# Patient Record
Sex: Female | Born: 1940 | Race: White | Hispanic: No | Smoking: Former smoker
Health system: Southern US, Community
[De-identification: ages and names within clinical notes are randomized; demographics above are authoritative.]

## PROBLEM LIST (undated history)

## (undated) DIAGNOSIS — T8859XA Other complications of anesthesia, initial encounter: Secondary | ICD-10-CM

## (undated) DIAGNOSIS — Z9889 Other specified postprocedural states: Secondary | ICD-10-CM

## (undated) DIAGNOSIS — Z87898 Personal history of other specified conditions: Secondary | ICD-10-CM

## (undated) DIAGNOSIS — T4145XA Adverse effect of unspecified anesthetic, initial encounter: Secondary | ICD-10-CM

## (undated) DIAGNOSIS — Z9289 Personal history of other medical treatment: Secondary | ICD-10-CM

## (undated) DIAGNOSIS — K8050 Calculus of bile duct without cholangitis or cholecystitis without obstruction: Secondary | ICD-10-CM

## (undated) DIAGNOSIS — I10 Essential (primary) hypertension: Secondary | ICD-10-CM

## (undated) DIAGNOSIS — E11621 Type 2 diabetes mellitus with foot ulcer: Secondary | ICD-10-CM

## (undated) DIAGNOSIS — R112 Nausea with vomiting, unspecified: Secondary | ICD-10-CM

## (undated) DIAGNOSIS — E119 Type 2 diabetes mellitus without complications: Secondary | ICD-10-CM

## (undated) DIAGNOSIS — L97529 Non-pressure chronic ulcer of other part of left foot with unspecified severity: Secondary | ICD-10-CM

## (undated) DIAGNOSIS — I739 Peripheral vascular disease, unspecified: Secondary | ICD-10-CM

## (undated) DIAGNOSIS — E785 Hyperlipidemia, unspecified: Secondary | ICD-10-CM

## (undated) HISTORY — DX: Personal history of other medical treatment: Z92.89

## (undated) HISTORY — DX: Essential (primary) hypertension: I10

## (undated) HISTORY — DX: Type 2 diabetes mellitus without complications: E11.9

## (undated) HISTORY — DX: Personal history of other specified conditions: Z87.898

## (undated) HISTORY — DX: Hyperlipidemia, unspecified: E78.5

---

## 1997-06-08 HISTORY — PX: ROTATOR CUFF REPAIR: SHX139

## 1997-07-20 ENCOUNTER — Other Ambulatory Visit: Admission: RE | Admit: 1997-07-20 | Discharge: 1997-07-20 | Payer: Self-pay | Admitting: Internal Medicine

## 1997-10-25 ENCOUNTER — Ambulatory Visit (HOSPITAL_BASED_OUTPATIENT_CLINIC_OR_DEPARTMENT_OTHER): Admission: RE | Admit: 1997-10-25 | Discharge: 1997-10-25 | Payer: Self-pay | Admitting: Orthopedic Surgery

## 1997-10-29 ENCOUNTER — Encounter: Admission: RE | Admit: 1997-10-29 | Discharge: 1998-01-27 | Payer: Self-pay | Admitting: Orthopedic Surgery

## 1997-12-21 ENCOUNTER — Other Ambulatory Visit: Admission: RE | Admit: 1997-12-21 | Discharge: 1997-12-21 | Payer: Self-pay | Admitting: Internal Medicine

## 1998-01-28 ENCOUNTER — Encounter: Admission: RE | Admit: 1998-01-28 | Discharge: 1998-04-28 | Payer: Self-pay | Admitting: Internal Medicine

## 1998-06-13 ENCOUNTER — Other Ambulatory Visit: Admission: RE | Admit: 1998-06-13 | Discharge: 1998-06-13 | Payer: Self-pay | Admitting: Obstetrics and Gynecology

## 1999-06-05 ENCOUNTER — Ambulatory Visit (HOSPITAL_COMMUNITY): Admission: RE | Admit: 1999-06-05 | Discharge: 1999-06-05 | Payer: Self-pay | Admitting: Gastroenterology

## 1999-10-06 ENCOUNTER — Encounter: Admission: RE | Admit: 1999-10-06 | Discharge: 2000-01-04 | Payer: Self-pay | Admitting: Internal Medicine

## 2000-01-19 ENCOUNTER — Encounter: Admission: RE | Admit: 2000-01-19 | Discharge: 2000-04-18 | Payer: Self-pay | Admitting: Internal Medicine

## 2000-03-12 ENCOUNTER — Encounter: Payer: Self-pay | Admitting: Orthopedic Surgery

## 2000-03-12 ENCOUNTER — Encounter: Admission: RE | Admit: 2000-03-12 | Discharge: 2000-03-12 | Payer: Self-pay | Admitting: Orthopedic Surgery

## 2000-03-16 ENCOUNTER — Other Ambulatory Visit: Admission: RE | Admit: 2000-03-16 | Discharge: 2000-03-16 | Payer: Self-pay | Admitting: Orthopaedic Surgery

## 2000-04-27 ENCOUNTER — Other Ambulatory Visit: Admission: RE | Admit: 2000-04-27 | Discharge: 2000-04-27 | Payer: Self-pay | Admitting: Obstetrics and Gynecology

## 2000-05-08 HISTORY — PX: CARDIAC CATHETERIZATION: SHX172

## 2000-06-02 ENCOUNTER — Encounter: Admission: RE | Admit: 2000-06-02 | Discharge: 2000-06-02 | Payer: Self-pay | Admitting: Cardiology

## 2000-06-02 ENCOUNTER — Encounter: Payer: Self-pay | Admitting: Cardiology

## 2000-06-04 ENCOUNTER — Ambulatory Visit (HOSPITAL_COMMUNITY): Admission: RE | Admit: 2000-06-04 | Discharge: 2000-06-04 | Payer: Self-pay | Admitting: Cardiology

## 2001-01-18 ENCOUNTER — Ambulatory Visit (HOSPITAL_COMMUNITY): Admission: RE | Admit: 2001-01-18 | Discharge: 2001-01-18 | Payer: Self-pay | Admitting: Family Medicine

## 2001-02-18 ENCOUNTER — Encounter: Payer: Self-pay | Admitting: Family Medicine

## 2001-02-18 ENCOUNTER — Encounter: Admission: RE | Admit: 2001-02-18 | Discharge: 2001-02-18 | Payer: Self-pay | Admitting: Family Medicine

## 2001-05-24 ENCOUNTER — Other Ambulatory Visit: Admission: RE | Admit: 2001-05-24 | Discharge: 2001-05-24 | Payer: Self-pay | Admitting: Obstetrics and Gynecology

## 2001-10-06 HISTORY — PX: KNEE ARTHROSCOPY: SUR90

## 2001-11-08 ENCOUNTER — Ambulatory Visit: Admission: RE | Admit: 2001-11-08 | Discharge: 2001-11-08 | Payer: Self-pay | Admitting: Orthopedic Surgery

## 2001-12-07 ENCOUNTER — Encounter (HOSPITAL_BASED_OUTPATIENT_CLINIC_OR_DEPARTMENT_OTHER): Admission: RE | Admit: 2001-12-07 | Discharge: 2002-02-09 | Payer: Self-pay | Admitting: Internal Medicine

## 2002-02-15 ENCOUNTER — Encounter: Admission: RE | Admit: 2002-02-15 | Discharge: 2002-03-07 | Payer: Self-pay | Admitting: Internal Medicine

## 2002-03-08 ENCOUNTER — Encounter (HOSPITAL_BASED_OUTPATIENT_CLINIC_OR_DEPARTMENT_OTHER): Admission: RE | Admit: 2002-03-08 | Discharge: 2002-06-06 | Payer: Self-pay | Admitting: Internal Medicine

## 2002-04-21 ENCOUNTER — Encounter (HOSPITAL_BASED_OUTPATIENT_CLINIC_OR_DEPARTMENT_OTHER): Payer: Self-pay | Admitting: Internal Medicine

## 2002-04-21 ENCOUNTER — Encounter: Admission: RE | Admit: 2002-04-21 | Discharge: 2002-04-21 | Payer: Self-pay | Admitting: Internal Medicine

## 2002-05-26 ENCOUNTER — Other Ambulatory Visit: Admission: RE | Admit: 2002-05-26 | Discharge: 2002-05-26 | Payer: Self-pay | Admitting: Obstetrics and Gynecology

## 2002-06-27 ENCOUNTER — Encounter (HOSPITAL_BASED_OUTPATIENT_CLINIC_OR_DEPARTMENT_OTHER): Admission: RE | Admit: 2002-06-27 | Discharge: 2002-09-25 | Payer: Self-pay | Admitting: Internal Medicine

## 2002-09-25 ENCOUNTER — Encounter (HOSPITAL_BASED_OUTPATIENT_CLINIC_OR_DEPARTMENT_OTHER): Admission: RE | Admit: 2002-09-25 | Discharge: 2002-12-24 | Payer: Self-pay | Admitting: Internal Medicine

## 2002-12-29 ENCOUNTER — Encounter (HOSPITAL_BASED_OUTPATIENT_CLINIC_OR_DEPARTMENT_OTHER): Admission: RE | Admit: 2002-12-29 | Discharge: 2003-03-29 | Payer: Self-pay | Admitting: Internal Medicine

## 2003-04-09 HISTORY — PX: TOE SURGERY: SHX1073

## 2003-10-02 ENCOUNTER — Other Ambulatory Visit: Admission: RE | Admit: 2003-10-02 | Discharge: 2003-10-02 | Payer: Self-pay | Admitting: Obstetrics and Gynecology

## 2004-06-08 DIAGNOSIS — Z9289 Personal history of other medical treatment: Secondary | ICD-10-CM

## 2004-06-08 HISTORY — DX: Personal history of other medical treatment: Z92.89

## 2004-07-09 ENCOUNTER — Ambulatory Visit: Admission: RE | Admit: 2004-07-09 | Discharge: 2004-07-09 | Payer: Self-pay | Admitting: Orthopedic Surgery

## 2004-08-06 ENCOUNTER — Ambulatory Visit: Payer: Self-pay | Admitting: Physical Medicine & Rehabilitation

## 2004-08-06 ENCOUNTER — Inpatient Hospital Stay (HOSPITAL_COMMUNITY): Admission: RE | Admit: 2004-08-06 | Discharge: 2004-08-11 | Payer: Self-pay | Admitting: Orthopedic Surgery

## 2004-10-21 ENCOUNTER — Ambulatory Visit (HOSPITAL_COMMUNITY): Admission: RE | Admit: 2004-10-21 | Discharge: 2004-10-21 | Payer: Self-pay | Admitting: Gastroenterology

## 2006-04-26 ENCOUNTER — Encounter: Admission: RE | Admit: 2006-04-26 | Discharge: 2006-04-26 | Payer: Self-pay | Admitting: Orthopedic Surgery

## 2006-05-12 ENCOUNTER — Ambulatory Visit (HOSPITAL_BASED_OUTPATIENT_CLINIC_OR_DEPARTMENT_OTHER): Admission: RE | Admit: 2006-05-12 | Discharge: 2006-05-12 | Payer: Self-pay | Admitting: Orthopedic Surgery

## 2010-05-08 HISTORY — PX: TRANSTHORACIC ECHOCARDIOGRAM: SHX275

## 2013-03-16 ENCOUNTER — Encounter: Payer: Self-pay | Admitting: *Deleted

## 2013-03-16 ENCOUNTER — Ambulatory Visit (INDEPENDENT_AMBULATORY_CARE_PROVIDER_SITE_OTHER): Payer: BC Managed Care – PPO | Admitting: Internal Medicine

## 2013-03-16 ENCOUNTER — Encounter: Payer: Self-pay | Admitting: Internal Medicine

## 2013-03-16 VITALS — BP 124/58 | HR 82 | Ht 67.0 in | Wt 192.9 lb

## 2013-03-16 DIAGNOSIS — R002 Palpitations: Secondary | ICD-10-CM

## 2013-03-16 DIAGNOSIS — E785 Hyperlipidemia, unspecified: Secondary | ICD-10-CM

## 2013-03-16 DIAGNOSIS — I10 Essential (primary) hypertension: Secondary | ICD-10-CM | POA: Insufficient documentation

## 2013-03-16 DIAGNOSIS — E119 Type 2 diabetes mellitus without complications: Secondary | ICD-10-CM | POA: Insufficient documentation

## 2013-03-16 NOTE — Progress Notes (Signed)
OFFICE NOTE  Chief Complaint:  Routine follow-up  Primary Care Physician: Raynelle Jan., MD  HPI:  Rachel Wong  is a 72 year old female previously followed by Dr. Clarene Duke with history of palpitations improved with electrolyte replacement and adjustments in her beta blocker. She also has hypertension which has been well controlled, hyperlipidemia and diabetes. She reports being fairly stable. I reviewed results from her primary care provider's office. She recently had a cholesterol check which showed total cholesterol to be 158. Her triglycerides were slightly elevated and she is continuing to work on her diet. She has lost several pounds and I think this will be helpful for her. With her and her blood glucose control, her hemoglobin A1c is 5.8 and has been stable at that level. She denies any further palpitations. She occasionally gets cramps in her left leg and does have varicose veins. There is a slight amount of swelling and she previously had left knee surgery. Other than those few episodes, she is really asymptomatic.  PMHx:  Past Medical History  Diagnosis Date  . History of palpitations   . Hypertension   . Hyperlipidemia   . Diabetes   . History of nuclear stress test 2006    adenosine; normal perfusion, no fixed or reversible defects    Past Surgical History  Procedure Laterality Date  . Cardiac catheterization  05/2000    r/t false-positive stress test; normal coronaries - Dr. Mervyn Skeeters. [facility]   . Transthoracic echocardiogram  05/2010    EF=>55%, stage 1 diastolic dysfunction; mild MR; mild AVR & AV mildly sclerotic  . Rotator cuff repair  1999  . Knee arthroscopy Right 10/2001  . Toe surgery Right 04/2003    FAMHx:  Family History  Problem Relation Age of Onset  . Cancer Mother   . Alzheimer's disease Mother   . Lung cancer Father   . Cancer Maternal Grandmother   . Heart attack Maternal Grandfather   . Liver disease Brother     transplant  . Thyroid disease  Child     thyroidectomy    SOCHx:   reports that she quit smoking about 46 years ago. Her smoking use included Cigarettes. She has a 20 pack-year smoking history. She has never used smokeless tobacco. Her alcohol and drug histories are not on file.  ALLERGIES:  No Known Allergies  ROS: A comprehensive review of systems was negative except for: Cardiovascular: positive for lower extremity edema and palpitations  HOME MEDS: Current Outpatient Prescriptions  Medication Sig Dispense Refill  . calcium citrate (CALCITRATE - DOSED IN MG ELEMENTAL CALCIUM) 950 MG tablet Take 1 tablet by mouth daily.      . enalapril (VASOTEC) 20 MG tablet Take 20 mg by mouth daily.      . magnesium oxide (MAG-OX) 400 MG tablet Take 400 mg by mouth daily.      . metFORMIN (GLUCOPHAGE-XR) 500 MG 24 hr tablet Take 2 tablets by mouth 2 (two) times daily.      . metoprolol succinate (TOPROL-XL) 100 MG 24 hr tablet Take 50 mg by mouth daily.       . Multiple Vitamin (MULTIVITAMIN) capsule Take 1 capsule by mouth daily.      . Omega-3 Fatty Acids (FISH OIL) 1000 MG CAPS Take 1 capsule by mouth daily.       . pravastatin (PRAVACHOL) 40 MG tablet Take 1 tablet by mouth daily.      Marland Kitchen VICTOZA 18 MG/3ML SOPN Inject 1.8 mg into the skin  every morning.        No current facility-administered medications for this visit.    LABS/IMAGING: No results found for this or any previous visit (from the past 48 hour(s)). No results found.  VITALS: BP 124/58  Pulse 82  Ht 5\' 7"  (1.702 m)  Wt 192 lb 14.4 oz (87.499 kg)  BMI 30.21 kg/m2  EXAM: General appearance: alert and no distress Neck: no adenopathy, no carotid bruit, no JVD, supple, symmetrical, trachea midline and thyroid not enlarged, symmetric, no tenderness/mass/nodules Lungs: clear to auscultation bilaterally Heart: regular rate and rhythm, S1, S2 normal, no murmur, click, rub or gallop Abdomen: soft, non-tender; bowel sounds normal; no masses,  no  organomegaly Extremities: extremities normal, atraumatic, no cyanosis or edema Pulses: 2+ and symmetric A few varicose veins in the left leg, mild hemosiderin deposition Skin: Skin color, texture, turgor normal. No rashes or lesions Neurologic: Grossly normal Psych: Mood, affect pleasant, normal  EKG: Normal sinus rhythm at 82  ASSESSMENT: 1. Palpitations-controlled 2. Hypertension-at goal on enalapril and metoprolol 3. Dyslipidemia-total cholesterol 158 on pravastatin and fish oil 4. Diabetes type 2-well controlled, A1c 5.8  PLAN: 1.   [name] is doing well without any complaints. Her diabetes, hypertension and dyslipidemia are all well controlled. She is managing to lose some weight and she's had no further palpitations. I would recommend continuing her current medications and we will see her back in one year.  Chrystie Nose, MD, [facility] Attending Cardiologist [facility]  HILTY,Kenneth C 03/16/2013, 5:46 PM

## 2013-03-16 NOTE — Patient Instructions (Signed)
Your physician wants you to follow-up in: 1 year. You will receive a reminder letter in the mail two months in advance. If you don't receive a letter, please call our office to schedule the follow-up appointment.  

## 2013-03-17 ENCOUNTER — Encounter: Payer: Self-pay | Admitting: Internal Medicine

## 2014-04-06 ENCOUNTER — Ambulatory Visit (INDEPENDENT_AMBULATORY_CARE_PROVIDER_SITE_OTHER): Payer: BC Managed Care – PPO | Admitting: Internal Medicine

## 2014-04-06 ENCOUNTER — Encounter: Payer: Self-pay | Admitting: Internal Medicine

## 2014-04-06 VITALS — BP 144/69 | HR 87 | Ht 65.5 in | Wt 183.8 lb

## 2014-04-06 DIAGNOSIS — E785 Hyperlipidemia, unspecified: Secondary | ICD-10-CM

## 2014-04-06 DIAGNOSIS — I10 Essential (primary) hypertension: Secondary | ICD-10-CM

## 2014-04-06 DIAGNOSIS — E119 Type 2 diabetes mellitus without complications: Secondary | ICD-10-CM

## 2014-04-06 DIAGNOSIS — R002 Palpitations: Secondary | ICD-10-CM

## 2014-04-06 NOTE — Progress Notes (Signed)
OFFICE NOTE  Chief Complaint:  Routine follow-up  Primary Care Physician: Raynelle JanSPRY,HEATHER M., MD  HPI:  Rachel Wong  is a 73 year old female previously followed by Dr. Clarene DukeLittle with history of palpitations improved with electrolyte replacement and adjustments in her beta blocker. She also has hypertension which has been well controlled, hyperlipidemia and diabetes. She reports being fairly stable. I reviewed results from her primary care provider's office. She recently had a cholesterol check which showed total cholesterol to be 158. Her triglycerides were slightly elevated and she is continuing to work on her diet. She has lost several pounds and I think this will be helpful for her. With her and her blood glucose control, her hemoglobin A1c is 5.8 and has been stable at that level. She denies any further palpitations. She occasionally gets cramps in her left leg and does have varicose veins. There is a slight amount of swelling and she previously had left knee surgery. Other than those few episodes, she is really asymptomatic.  Rachel Wong returns today for follow-up. She denies any palpitations. She reports that as long she takes her magnesium she seems to be doing fairly well. She is talking about possibly retiring in December. 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 Her blood pressure is well controlled. She reports her A1c is at 6 which represents good control.  PMHx:  Past Medical History  Diagnosis Date  . History of palpitations   . Hypertension   . Hyperlipidemia   . Diabetes   . History of nuclear stress test 2006    adenosine; normal perfusion, no fixed or reversible defects    Past Surgical History  Procedure Laterality Date  . Cardiac catheterization  05/2000    r/t false-positive stress test; normal coronaries - Dr. Mervyn SkeetersA. [facility]   . Transthoracic echocardiogram  05/2010    EF=>55%, stage 1 diastolic dysfunction; mild MR; mild AVR & AV mildly sclerotic    . Rotator cuff repair  1999  . Knee arthroscopy Right 10/2001  . Toe surgery Right 04/2003    FAMHx:  Family History  Problem Relation Age of Onset  . Cancer Mother   . Alzheimer's disease Mother   . Lung cancer Father   . Cancer Maternal Grandmother   . Heart attack Maternal Grandfather   . Liver disease Brother     transplant  . Thyroid disease Child     thyroidectomy    SOCHx:   reports that she quit smoking about 47 years ago. Her smoking use included Cigarettes. She has a 20 pack-year smoking history. She has never used smokeless tobacco. Her alcohol and drug histories are not on file.  ALLERGIES:  No Known Allergies  ROS: A comprehensive review of systems was negative.  HOME MEDS: Current Outpatient Prescriptions  Medication Sig Dispense Refill  . calcium citrate (CALCITRATE - DOSED IN MG ELEMENTAL CALCIUM) 950 MG tablet Take 1 tablet by mouth daily.      . enalapril (VASOTEC) 20 MG tablet Take 20 mg by mouth daily.      . magnesium oxide (MAG-OX) 400 MG tablet Take 400 mg by mouth daily.      . metFORMIN (GLUCOPHAGE-XR) 500 MG 24 hr tablet Take 2 tablets by mouth 2 (two) times daily.      . metoprolol succinate (TOPROL-XL) 100 MG 24 hr tablet Take 50 mg by mouth daily.       . Multiple Vitamin (MULTIVITAMIN) capsule Take 1 capsule by mouth daily.      .Marland Kitchen  Omega-3 Fatty Acids (FISH OIL) 1000 MG CAPS Take 1 capsule by mouth daily.       . pravastatin (PRAVACHOL) 40 MG tablet Take 1 tablet by mouth daily.      Marland Kitchen. VICTOZA 18 MG/3ML SOPN Inject 1.8 mg into the skin every morning.        No current facility-administered medications for this visit.    LABS/IMAGING: No results found for this or any previous visit (from the past 48 hour(s)). No results found.  VITALS: BP 144/69  Pulse 87  Ht 5' 5.5" (1.664 m)  Wt 183 lb 12.8 oz (83.371 kg)  BMI 30.11 kg/m2  EXAM: General appearance: alert and no distress Neck: no adenopathy, no carotid bruit, no JVD, supple,  symmetrical, trachea midline and thyroid not enlarged, symmetric, no tenderness/mass/nodules Lungs: clear to auscultation bilaterally Heart: regular rate and rhythm, S1, S2 normal, no murmur, click, rub or gallop Abdomen: soft, non-tender; bowel sounds normal; no masses,  no organomegaly Extremities: extremities normal, atraumatic, no cyanosis or edema Pulses: 2+ and symmetric A few varicose veins in the left leg, mild hemosiderin deposition Skin: Skin color, texture, turgor normal. No rashes or lesions Neurologic: Grossly normal Psych: Mood, affect pleasant, normal  EKG: Normal sinus rhythm at 87  ASSESSMENT: 1. Palpitations-controlled 2. Hypertension-at goal on enalapril and metoprolol 3. Dyslipidemia-total cholesterol 158 on pravastatin and fish oil 4. Diabetes type 2-well controlled, A1c 6  PLAN: 1.   Rachel Wong is doing well without any complaints. Her diabetes, hypertension and dyslipidemia are all well controlled. She is managing to lose some weight and she's had no further palpitations. I would recommend continuing her current medications and we will see her back in one year.  Chrystie NoseKenneth C. Tayte Mcwherter, MD, [facility] Attending Cardiologist [facility]  Analis Distler C 04/06/2014, 5:44 PM

## 2014-04-06 NOTE — Patient Instructions (Signed)
Your physician wants you to follow-up in: 1 year with Dr. Hilty. You will receive a reminder letter in the mail two months in advance. If you don't receive a letter, please call our office to schedule the follow-up appointment.  

## 2015-05-08 ENCOUNTER — Ambulatory Visit (INDEPENDENT_AMBULATORY_CARE_PROVIDER_SITE_OTHER): Payer: BC Managed Care – PPO | Admitting: Internal Medicine

## 2015-05-08 ENCOUNTER — Encounter: Payer: Self-pay | Admitting: Internal Medicine

## 2015-05-08 VITALS — BP 100/60 | HR 83 | Ht 65.0 in | Wt 176.9 lb

## 2015-05-08 DIAGNOSIS — E785 Hyperlipidemia, unspecified: Secondary | ICD-10-CM

## 2015-05-08 DIAGNOSIS — I10 Essential (primary) hypertension: Secondary | ICD-10-CM | POA: Diagnosis not present

## 2015-05-08 DIAGNOSIS — R002 Palpitations: Secondary | ICD-10-CM | POA: Diagnosis not present

## 2015-05-08 NOTE — Progress Notes (Signed)
OFFICE NOTE  Chief Complaint:  Routine follow-up  Primary Care Physician: Raynelle JanSPRY,HEATHER M., MD  HPI:  Rachel Wong  is a 74 year old female previously followed by Dr. Clarene DukeLittle with history of palpitations improved with electrolyte replacement and adjustments in her beta blocker. She also has hypertension which has been well controlled, hyperlipidemia and diabetes. She reports being fairly stable. I reviewed results from her primary care provider's office. She recently had a cholesterol check which showed total cholesterol to be 158. Her triglycerides were slightly elevated and she is continuing to work on her diet. She has lost several pounds and I think this will be helpful for her. With her and her blood glucose control, her hemoglobin A1c is 5.8 and has been stable at that level. She denies any further palpitations. She occasionally gets cramps in her left leg and does have varicose veins. There is a slight amount of swelling and she previously had left knee surgery. Other than those few episodes, she is really asymptomatic.  Rachel Wong returns today for follow-up. She denies any palpitations. She reports that as long she takes her magnesium she seems to be doing fairly well. She is talking about possibly retiring in December. 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 Her blood pressure is well controlled. She reports her A1c is at 6 which represents good control.  Rachel Wong returns today for follow-up. She is reportedly asymptomatic, denying any palpitations, chest pain or shortness of breath. She has mildly low blood pressure today although reports a generally is between 120 and 140 systolic. Her EKG shows normal sinus rhythm without any ischemic changes. She occasionally gets some cramping in her legs and her hands. She is takes magnesium oxide but this is not remedy the problem. After talking to her daughter she is convinced this is related to pravastatin. She's been on  this for some time with good cholesterol control.  PMHx:  Past Medical History  Diagnosis Date  . History of palpitations   . Hypertension   . Hyperlipidemia   . Diabetes (HCC)   . History of nuclear stress test 2006    adenosine; normal perfusion, no fixed or reversible defects    Past Surgical History  Procedure Laterality Date  . Cardiac catheterization  05/2000    r/t false-positive stress test; normal coronaries - Dr. Mervyn SkeetersA. [facility]   . Transthoracic echocardiogram  05/2010    EF=>55%, stage 1 diastolic dysfunction; mild MR; mild AVR & AV mildly sclerotic  . Rotator cuff repair  1999  . Knee arthroscopy Right 10/2001  . Toe surgery Right 04/2003    FAMHx:  Family History  Problem Relation Age of Onset  . Cancer Mother   . Alzheimer's disease Mother   . Lung cancer Father   . Cancer Maternal Grandmother   . Heart attack Maternal Grandfather   . Liver disease Brother     transplant  . Thyroid disease Child     thyroidectomy    SOCHx:   reports that she quit smoking about 48 years ago. Her smoking use included Cigarettes. She has a 20 pack-year smoking history. She has never used smokeless tobacco. Her alcohol and drug histories are not on file.  ALLERGIES:  No Known Allergies  ROS: A comprehensive review of systems was negative except for: Musculoskeletal: positive for myalgias and Hand and feet cramping  HOME MEDS: Current Outpatient Prescriptions  Medication Sig Dispense Refill  . alendronate (FOSAMAX) 70 MG tablet Take 70 mg  by mouth once a week.    . calcium citrate (CALCITRATE - DOSED IN MG ELEMENTAL CALCIUM) 950 MG tablet Take 1 tablet by mouth daily.    . enalapril (VASOTEC) 20 MG tablet Take 20 mg by mouth daily.    . magnesium oxide (MAG-OX) 400 MG tablet Take 400 mg by mouth daily.    . metFORMIN (GLUCOPHAGE-XR) 500 MG 24 hr tablet Take 2 tablets by mouth 2 (two) times daily.    . metoprolol succinate (TOPROL-XL) 100 MG 24 hr tablet Take 50 mg by mouth  daily.     . Multiple Vitamin (MULTIVITAMIN) capsule Take 1 capsule by mouth daily.    . Omega-3 Fatty Acids (FISH OIL) 1000 MG CAPS Take 1 capsule by mouth daily.     . pravastatin (PRAVACHOL) 40 MG tablet Take 1 tablet by mouth daily.    Marland Kitchen VICTOZA 18 MG/3ML SOPN Inject 1.8 mg into the skin every morning.      No current facility-administered medications for this visit.    LABS/IMAGING: No results found for this or any previous visit (from the past 48 hour(s)). No results found.  VITALS: BP 100/60 mmHg  Pulse 83  Ht  (1.651 m)  Wt 176 lb 14.4 oz (80.241 kg)  BMI 29.44 kg/m2  EXAM: General appearance: alert and no distress Neck: no adenopathy, no carotid bruit, no JVD, supple, symmetrical, trachea midline and thyroid not enlarged, symmetric, no tenderness/mass/nodules Lungs: clear to auscultation bilaterally Heart: regular rate and rhythm, S1, S2 normal, no murmur, click, rub or gallop Abdomen: soft, non-tender; bowel sounds normal; no masses,  no organomegaly Extremities: extremities normal, atraumatic, no cyanosis or edema Pulses: 2+ and symmetric A few varicose veins in the left leg, mild hemosiderin deposition Skin: Skin color, texture, turgor normal. No rashes or lesions Neurologic: Grossly normal Psych: Mood, affect pleasant, normal  EKG: Normal sinus rhythm at 83  ASSESSMENT: 1. Palpitations-controlled 2. Hypertension-at goal on enalapril and metoprolol 3. Dyslipidemia-On pravastatin 4. Diabetes type 2-well controlled, A1c 6 5. Hand and feet cramping  PLAN: 1.   Rachel Wong is doing well without any complaints. Her diabetes, hypertension and dyslipidemia are all well controlled. She is managing to lose some weight and she's had no further palpitations. She does have complaints of hand and feet cramping which she attributes to pravastatin. I told her she may want to consider a two-week statin holiday to see if her symptoms get better. She can contact me if this  is the case and we can consider an alternative cholesterol medicine. Another option would be adding coenzyme Q10 100 mg daily to her regimen. Plan to see her back in one year.  Chrystie Nose, MD, [facility] Attending Cardiologist [facility]  Chrystie Nose 05/08/2015, 4:58 PM

## 2015-05-08 NOTE — Patient Instructions (Addendum)
Your physician wants you to follow-up in: 1 year or sooner if needed. You will receive a reminder letter in the mail two months in advance. If you don't receive a letter, please call our office to schedule the follow-up appointment.  Call back  2 weeks after being off of the cholesterol medication to report a update of your symptoms.

## 2016-04-08 DIAGNOSIS — K8050 Calculus of bile duct without cholangitis or cholecystitis without obstruction: Secondary | ICD-10-CM

## 2016-04-08 HISTORY — DX: Calculus of bile duct without cholangitis or cholecystitis without obstruction: K80.50

## 2016-04-27 ENCOUNTER — Encounter (HOSPITAL_BASED_OUTPATIENT_CLINIC_OR_DEPARTMENT_OTHER): Payer: Self-pay | Admitting: *Deleted

## 2016-04-27 ENCOUNTER — Emergency Department (HOSPITAL_BASED_OUTPATIENT_CLINIC_OR_DEPARTMENT_OTHER): Payer: Medicare Other

## 2016-04-27 ENCOUNTER — Inpatient Hospital Stay (HOSPITAL_BASED_OUTPATIENT_CLINIC_OR_DEPARTMENT_OTHER)
Admission: EM | Admit: 2016-04-27 | Discharge: 2016-05-04 | DRG: 417 | Disposition: A | Discharge status: Skilled Nursing Facility | Payer: Medicare Other | Attending: Family Medicine | Admitting: Family Medicine

## 2016-04-27 DIAGNOSIS — IMO0002 Reserved for concepts with insufficient information to code with codable children: Secondary | ICD-10-CM

## 2016-04-27 DIAGNOSIS — K8063 Calculus of gallbladder and bile duct with acute cholecystitis with obstruction: Principal | ICD-10-CM | POA: Diagnosis present

## 2016-04-27 DIAGNOSIS — E785 Hyperlipidemia, unspecified: Secondary | ICD-10-CM | POA: Diagnosis present

## 2016-04-27 DIAGNOSIS — K859 Acute pancreatitis without necrosis or infection, unspecified: Secondary | ICD-10-CM

## 2016-04-27 DIAGNOSIS — I7090 Unspecified atherosclerosis: Secondary | ICD-10-CM

## 2016-04-27 DIAGNOSIS — I4891 Unspecified atrial fibrillation: Secondary | ICD-10-CM | POA: Diagnosis not present

## 2016-04-27 DIAGNOSIS — I10 Essential (primary) hypertension: Secondary | ICD-10-CM | POA: Diagnosis present

## 2016-04-27 DIAGNOSIS — R002 Palpitations: Secondary | ICD-10-CM | POA: Diagnosis present

## 2016-04-27 DIAGNOSIS — E7800 Pure hypercholesterolemia, unspecified: Secondary | ICD-10-CM

## 2016-04-27 DIAGNOSIS — E1151 Type 2 diabetes mellitus with diabetic peripheral angiopathy without gangrene: Secondary | ICD-10-CM | POA: Diagnosis present

## 2016-04-27 DIAGNOSIS — E118 Type 2 diabetes mellitus with unspecified complications: Secondary | ICD-10-CM

## 2016-04-27 DIAGNOSIS — Z801 Family history of malignant neoplasm of trachea, bronchus and lung: Secondary | ICD-10-CM | POA: Diagnosis not present

## 2016-04-27 DIAGNOSIS — Z87891 Personal history of nicotine dependence: Secondary | ICD-10-CM

## 2016-04-27 DIAGNOSIS — I493 Ventricular premature depolarization: Secondary | ICD-10-CM | POA: Diagnosis present

## 2016-04-27 DIAGNOSIS — K8050 Calculus of bile duct without cholangitis or cholecystitis without obstruction: Secondary | ICD-10-CM

## 2016-04-27 DIAGNOSIS — K851 Biliary acute pancreatitis without necrosis or infection: Secondary | ICD-10-CM | POA: Diagnosis present

## 2016-04-27 DIAGNOSIS — I272 Pulmonary hypertension, unspecified: Secondary | ICD-10-CM | POA: Diagnosis present

## 2016-04-27 DIAGNOSIS — E1165 Type 2 diabetes mellitus with hyperglycemia: Secondary | ICD-10-CM

## 2016-04-27 DIAGNOSIS — Z7984 Long term (current) use of oral hypoglycemic drugs: Secondary | ICD-10-CM

## 2016-04-27 DIAGNOSIS — I480 Paroxysmal atrial fibrillation: Secondary | ICD-10-CM | POA: Diagnosis present

## 2016-04-27 DIAGNOSIS — Z8249 Family history of ischemic heart disease and other diseases of the circulatory system: Secondary | ICD-10-CM

## 2016-04-27 DIAGNOSIS — Z79899 Other long term (current) drug therapy: Secondary | ICD-10-CM | POA: Diagnosis not present

## 2016-04-27 DIAGNOSIS — Z820 Family history of epilepsy and other diseases of the nervous system: Secondary | ICD-10-CM | POA: Diagnosis not present

## 2016-04-27 DIAGNOSIS — N179 Acute kidney failure, unspecified: Secondary | ICD-10-CM | POA: Diagnosis present

## 2016-04-27 DIAGNOSIS — Z419 Encounter for procedure for purposes other than remedying health state, unspecified: Secondary | ICD-10-CM

## 2016-04-27 DIAGNOSIS — K8035 Calculus of bile duct with chronic cholangitis with obstruction: Secondary | ICD-10-CM | POA: Diagnosis not present

## 2016-04-27 DIAGNOSIS — K8071 Calculus of gallbladder and bile duct without cholecystitis with obstruction: Secondary | ICD-10-CM

## 2016-04-27 DIAGNOSIS — K8001 Calculus of gallbladder with acute cholecystitis with obstruction: Secondary | ICD-10-CM | POA: Diagnosis present

## 2016-04-27 DIAGNOSIS — E119 Type 2 diabetes mellitus without complications: Secondary | ICD-10-CM

## 2016-04-27 DIAGNOSIS — K8051 Calculus of bile duct without cholangitis or cholecystitis with obstruction: Secondary | ICD-10-CM | POA: Diagnosis present

## 2016-04-27 HISTORY — DX: Nausea with vomiting, unspecified: R11.2

## 2016-04-27 HISTORY — DX: Calculus of bile duct without cholangitis or cholecystitis without obstruction: K80.50

## 2016-04-27 HISTORY — DX: Type 2 diabetes mellitus with foot ulcer: E11.621

## 2016-04-27 HISTORY — DX: Peripheral vascular disease, unspecified: I73.9

## 2016-04-27 HISTORY — DX: Other complications of anesthesia, initial encounter: T88.59XA

## 2016-04-27 HISTORY — DX: Adverse effect of unspecified anesthetic, initial encounter: T41.45XA

## 2016-04-27 HISTORY — DX: Other specified postprocedural states: Z98.890

## 2016-04-27 HISTORY — DX: Non-pressure chronic ulcer of other part of left foot with unspecified severity: L97.529

## 2016-04-27 LAB — CBC WITH DIFFERENTIAL/PLATELET
BASOS PCT: 0 %
Basophils Absolute: 0 10*3/uL (ref 0.0–0.1)
EOS ABS: 0 10*3/uL (ref 0.0–0.7)
Eosinophils Relative: 0 %
HCT: 36.1 % (ref 36.0–46.0)
Hemoglobin: 11.8 g/dL — ABNORMAL LOW (ref 12.0–15.0)
Lymphocytes Relative: 10 %
Lymphs Abs: 1.2 10*3/uL (ref 0.7–4.0)
MCH: 27.8 pg (ref 26.0–34.0)
MCHC: 32.7 g/dL (ref 30.0–36.0)
MCV: 84.9 fL (ref 78.0–100.0)
MONO ABS: 0.7 10*3/uL (ref 0.1–1.0)
MONOS PCT: 6 %
Neutro Abs: 9.8 10*3/uL — ABNORMAL HIGH (ref 1.7–7.7)
Neutrophils Relative %: 84 %
PLATELETS: 338 10*3/uL (ref 150–400)
RBC: 4.25 MIL/uL (ref 3.87–5.11)
RDW: 13.3 % (ref 11.5–15.5)
WBC: 11.6 10*3/uL — ABNORMAL HIGH (ref 4.0–10.5)

## 2016-04-27 LAB — GLUCOSE, CAPILLARY
GLUCOSE-CAPILLARY: 271 mg/dL — AB (ref 65–99)
GLUCOSE-CAPILLARY: 189 mg/dL — AB (ref 65–99)
Glucose-Capillary: 272 mg/dL — ABNORMAL HIGH (ref 65–99)
Glucose-Capillary: 173 mg/dL — ABNORMAL HIGH (ref 65–99)

## 2016-04-27 LAB — COMPREHENSIVE METABOLIC PANEL
ALBUMIN: 3.7 g/dL (ref 3.5–5.0)
ALK PHOS: 946 U/L — AB (ref 38–126)
ALT: 180 U/L — AB (ref 14–54)
AST: 183 U/L — ABNORMAL HIGH (ref 15–41)
Anion gap: 13 (ref 5–15)
BUN: 40 mg/dL — ABNORMAL HIGH (ref 6–20)
CALCIUM: 9.4 mg/dL (ref 8.9–10.3)
CO2: 25 mmol/L (ref 22–32)
CREATININE: 0.91 mg/dL (ref 0.44–1.00)
Chloride: 98 mmol/L — ABNORMAL LOW (ref 101–111)
GFR calc non Af Amer: 60 mL/min — ABNORMAL LOW (ref >60)
GLUCOSE: 303 mg/dL — AB (ref 65–99)
Potassium: 3.8 mmol/L (ref 3.5–5.1)
SODIUM: 136 mmol/L (ref 135–145)
Total Bilirubin: 1.9 mg/dL — ABNORMAL HIGH (ref 0.3–1.2)
Total Protein: 7.3 g/dL (ref 6.5–8.1)

## 2016-04-27 LAB — TROPONIN I
TROPONIN I: 0.03 ng/mL — AB (ref <0.03)
TROPONIN I: 0.03 ng/mL — AB (ref <0.03)

## 2016-04-27 LAB — LACTIC ACID, PLASMA
LACTIC ACID, VENOUS: 1.2 mmol/L (ref 0.5–1.9)
Lactic Acid, Venous: 1.5 mmol/L (ref 0.5–1.9)

## 2016-04-27 LAB — MRSA PCR SCREENING: MRSA BY PCR: NEGATIVE

## 2016-04-27 LAB — I-STAT CG4 LACTIC ACID, ED: LACTIC ACID, VENOUS: 1.25 mmol/L (ref 0.5–1.9)

## 2016-04-27 LAB — LIPASE, BLOOD: Lipase: 6436 U/L — ABNORMAL HIGH (ref 11–51)

## 2016-04-27 MED ORDER — FENTANYL CITRATE (PF) 100 MCG/2ML IJ SOLN: 50.0000 ug | INTRAMUSCULAR | Status: DC | PRN

## 2016-04-27 MED ORDER — ONDANSETRON HCL 4 MG/2ML IJ SOLN: 4.0000 mg | Freq: Three times a day (TID) | INTRAMUSCULAR | Status: DC | PRN

## 2016-04-27 MED ORDER — ONDANSETRON HCL 4 MG PO TABS: 4.0000 mg | ORAL_TABLET | Freq: Four times a day (QID) | ORAL | Status: DC | PRN

## 2016-04-27 MED ORDER — ONDANSETRON HCL 4 MG/2ML IJ SOLN
4.0000 mg | Freq: Once | INTRAMUSCULAR | Status: AC
  Administered 2016-04-27: 4 mg via INTRAVENOUS

## 2016-04-27 MED ORDER — ACETAMINOPHEN 325 MG PO TABS: 650.0000 mg | ORAL_TABLET | Freq: Four times a day (QID) | ORAL | Status: DC | PRN

## 2016-04-27 MED ORDER — FENTANYL CITRATE (PF) 100 MCG/2ML IJ SOLN: 100.0000 ug | INTRAMUSCULAR | Status: DC | PRN

## 2016-04-27 MED ORDER — ENALAPRIL MALEATE 20 MG PO TABS
20.0000 mg | ORAL_TABLET | Freq: Every day | ORAL | Status: DC
  Administered 2016-04-27 – 2016-04-29 (×3): 20 mg via ORAL
  Filled 2016-04-27 (×3): qty 1

## 2016-04-27 MED ORDER — PROMETHAZINE HCL 25 MG/ML IJ SOLN
12.5000 mg | Freq: Once | INTRAMUSCULAR | Status: AC | PRN
  Administered 2016-04-27: 12.5 mg via INTRAVENOUS
  Filled 2016-04-27: qty 1

## 2016-04-27 MED ORDER — SODIUM CHLORIDE 0.9 % IV BOLUS (SEPSIS)
500.0000 mL | Freq: Once | INTRAVENOUS | Status: AC
  Administered 2016-04-27: 500 mL via INTRAVENOUS

## 2016-04-27 MED ORDER — METOPROLOL SUCCINATE ER 25 MG PO TB24
50.0000 mg | ORAL_TABLET | Freq: Every day | ORAL | Status: DC
  Administered 2016-04-27 – 2016-04-29 (×3): 50 mg via ORAL
  Filled 2016-04-27 (×5): qty 2

## 2016-04-27 MED ORDER — SODIUM CHLORIDE 0.9 % IV SOLN
INTRAVENOUS | Status: AC
  Administered 2016-04-27: 07:00:00 via INTRAVENOUS

## 2016-04-27 MED ORDER — SODIUM CHLORIDE 0.9 % IV SOLN
INTRAVENOUS | Status: DC
  Administered 2016-04-27 – 2016-04-28 (×3): via INTRAVENOUS
  Administered 2016-04-29: 1 mL via INTRAVENOUS
  Administered 2016-04-29 – 2016-05-03 (×4): via INTRAVENOUS

## 2016-04-27 MED ORDER — ENOXAPARIN SODIUM 40 MG/0.4ML ~~LOC~~ SOLN
40.0000 mg | SUBCUTANEOUS | Status: DC
  Administered 2016-04-27: 40 mg via SUBCUTANEOUS
  Filled 2016-04-27: qty 0.4

## 2016-04-27 MED ORDER — IOPAMIDOL (ISOVUE-300) INJECTION 61%
100.0000 mL | Freq: Once | INTRAVENOUS | Status: AC | PRN
  Administered 2016-04-27: 100 mL via INTRAVENOUS

## 2016-04-27 MED ORDER — INSULIN ASPART 100 UNIT/ML ~~LOC~~ SOLN
0.0000 [IU] | Freq: Three times a day (TID) | SUBCUTANEOUS | Status: DC
  Administered 2016-04-27: 5 [IU] via SUBCUTANEOUS
  Administered 2016-04-27: 2 [IU] via SUBCUTANEOUS
  Administered 2016-04-28 (×3): 1 [IU] via SUBCUTANEOUS
  Administered 2016-04-30: 2 [IU] via SUBCUTANEOUS
  Administered 2016-04-30 – 2016-05-01 (×4): 3 [IU] via SUBCUTANEOUS
  Administered 2016-05-01: 2 [IU] via SUBCUTANEOUS
  Administered 2016-05-02 (×2): 3 [IU] via SUBCUTANEOUS
  Administered 2016-05-03: 2 [IU] via SUBCUTANEOUS
  Administered 2016-05-03: 7 [IU] via SUBCUTANEOUS
  Administered 2016-05-03: 9 [IU] via SUBCUTANEOUS
  Administered 2016-05-04: 5 [IU] via SUBCUTANEOUS
  Administered 2016-05-04: 9 [IU] via SUBCUTANEOUS

## 2016-04-27 MED ORDER — PIPERACILLIN-TAZOBACTAM 3.375 G IVPB
3.3750 g | Freq: Three times a day (TID) | INTRAVENOUS | Status: AC
  Administered 2016-04-27 – 2016-04-30 (×11): 3.375 g via INTRAVENOUS
  Filled 2016-04-27 (×13): qty 50

## 2016-04-27 MED ORDER — MORPHINE SULFATE (PF) 2 MG/ML IV SOLN
1.0000 mg | INTRAVENOUS | Status: DC | PRN
  Administered 2016-04-27 – 2016-05-03 (×6): 1 mg via INTRAVENOUS
  Filled 2016-04-27 (×6): qty 1

## 2016-04-27 MED ORDER — ONDANSETRON HCL 4 MG/2ML IJ SOLN
4.0000 mg | Freq: Three times a day (TID) | INTRAMUSCULAR | Status: DC | PRN
  Administered 2016-04-27: 4 mg via INTRAVENOUS
  Filled 2016-04-27: qty 2

## 2016-04-27 MED ORDER — SODIUM CHLORIDE 0.9% FLUSH
3.0000 mL | Freq: Two times a day (BID) | INTRAVENOUS | Status: DC
  Administered 2016-04-27 – 2016-05-04 (×11): 3 mL via INTRAVENOUS

## 2016-04-27 MED ORDER — TRAZODONE HCL 50 MG PO TABS
25.0000 mg | ORAL_TABLET | Freq: Every evening | ORAL | Status: DC | PRN
Start: 2016-04-27 — End: 2016-05-04

## 2016-04-27 MED ORDER — MAGNESIUM CITRATE PO SOLN
1.0000 | Freq: Once | ORAL | Status: DC | PRN
  Filled 2016-04-27: qty 296

## 2016-04-27 MED ORDER — BISACODYL 10 MG RE SUPP: 10.0000 mg | Freq: Every day | RECTAL | Status: DC | PRN

## 2016-04-27 MED ORDER — HYDROCODONE-ACETAMINOPHEN 5-325 MG PO TABS
1.0000 | ORAL_TABLET | ORAL | Status: DC | PRN
  Administered 2016-04-27 – 2016-05-04 (×4): 2 via ORAL
  Filled 2016-04-27 (×4): qty 2

## 2016-04-27 MED ORDER — PIPERACILLIN-TAZOBACTAM 3.375 G IVPB 30 MIN
3.3750 g | Freq: Once | INTRAVENOUS | Status: AC
  Administered 2016-04-27: 3.375 g via INTRAVENOUS
  Filled 2016-04-27 (×2): qty 50

## 2016-04-27 MED ORDER — HYDRALAZINE HCL 20 MG/ML IJ SOLN: 10.0000 mg | Freq: Three times a day (TID) | INTRAMUSCULAR | Status: DC | PRN

## 2016-04-27 MED ORDER — FENTANYL CITRATE (PF) 100 MCG/2ML IJ SOLN
50.0000 ug | Freq: Once | INTRAMUSCULAR | Status: AC
  Administered 2016-04-27: 50 ug via INTRAVENOUS
  Filled 2016-04-27: qty 2

## 2016-04-27 MED ORDER — ACETAMINOPHEN 650 MG RE SUPP: 650.0000 mg | Freq: Four times a day (QID) | RECTAL | Status: DC | PRN

## 2016-04-27 MED ORDER — ONDANSETRON HCL 4 MG/2ML IJ SOLN
4.0000 mg | Freq: Four times a day (QID) | INTRAMUSCULAR | Status: DC | PRN
  Administered 2016-04-27: 4 mg via INTRAVENOUS
  Filled 2016-04-27: qty 2

## 2016-04-27 MED ORDER — SENNOSIDES-DOCUSATE SODIUM 8.6-50 MG PO TABS: 1.0000 | ORAL_TABLET | Freq: Every evening | ORAL | Status: DC | PRN

## 2016-04-27 MED ORDER — PRAVASTATIN SODIUM 40 MG PO TABS
40.0000 mg | ORAL_TABLET | Freq: Every day | ORAL | Status: DC
  Administered 2016-04-27 – 2016-04-29 (×3): 40 mg via ORAL
  Filled 2016-04-27 (×4): qty 1

## 2016-04-27 MED ORDER — SODIUM CHLORIDE 0.9 % IV SOLN: INTRAVENOUS | Status: DC

## 2016-04-27 MED ORDER — PROMETHAZINE HCL 25 MG/ML IJ SOLN
INTRAMUSCULAR | Status: AC
  Administered 2016-04-27: 12.5 mg
  Filled 2016-04-27: qty 1

## 2016-04-27 MED ORDER — DILTIAZEM HCL-DEXTROSE 100-5 MG/100ML-% IV SOLN (PREMIX)
5.0000 mg/h | INTRAVENOUS | Status: DC
  Administered 2016-04-27: 5 mg/h via INTRAVENOUS
  Filled 2016-04-27: qty 100

## 2016-04-27 MED ORDER — ONDANSETRON HCL 4 MG/2ML IJ SOLN
INTRAMUSCULAR | Status: AC
  Filled 2016-04-27: qty 2

## 2016-04-27 MED ORDER — DILTIAZEM HCL 25 MG/5ML IV SOLN
20.0000 mg | Freq: Once | INTRAVENOUS | Status: AC
  Administered 2016-04-27: 20 mg via INTRAVENOUS
  Filled 2016-04-27: qty 5

## 2016-04-27 NOTE — ED Notes (Signed)
Attempt to call report to [phone]. RN not available.

## 2016-04-27 NOTE — Progress Notes (Signed)
CRITICAL VALUE ALERT  Critical value received:  Troponin 0.03  Date of notification:  04/27/16  Time of notification:  1230  Critical value read back:Yes.    Nurse who received alert:  Ronna PolioJennifer Jyron Turman  MD notified (1st page):  Hobbs  Time of first page:  1230  MD notified (2nd page):  Time of second page:  Responding MD:  Melynda RippleHobbs  Time MD responded:  1230

## 2016-04-27 NOTE — H&P (Signed)
History and Physical    Sallyann Kinnaird ZOX:[id number] DOB: 1940-09-08 DOA: 04/27/2016   PCP: Raynelle Jan., MD   Patient coming from:  Home   Chief Complaint: abdominal pain   HPI: Rachel Wong is a 75 y.o. female with medical history significant for HTN, HLD,presenting to [facility] with acute mid abdominal pain, nausea and  Non bloody vomiting since 2 am.  She was in her usual state of health until 2 days prior, developing mild nausea and vomiting, quickly resolving, without any recurrence until early a.m, accompanied by severe pain. Denies diarrhea. PAin does not extend to any surrounding areas. Pain at this time is 8/10 .  Denies any food poisoning . No recent trips. Denies fevers, chills, night sweats, vision changes, or mucositis. Denies any respiratory complaints. Denies any chest pain , but does have a history of intermitent palpitations, not seen by Cards for at least 1 yr. Denies lower extremity swelling.  Denies any dysuria. Denies abnormal skin rashes, or neuropathy.  She was treated with antibiotics until 2 days ago for diabetic ulcer at the first metatarsal (right) area,which iis now healed. Denies any bleeding issues such as epistaxis, ematuria or hematochezia. Ambulating without difficulty.  ED Course:  BP (!) 156/58 (BP Location: Left Arm)   Pulse 80   Temp 98 F (36.7 C) (Oral)   Resp (!) 23   Ht 5\' 6"  (1.676 m)   Wt 80.2 kg (176 lb 12.9 oz)   SpO2 97%   BMI 28.54 kg/m    WBC 11.6 hemoglobin 11.8 platelets 338 the UN 40 creatinine 0.91 troponin 0.03 glucose 303 lactic acid 1.25  Abnormal liver function with ALP 946, AST 183, ALT 180, total bilirubin 1.9. Lipase 6436. At [facility],  Found to have choledocholithiasis with severe duct dilatation.  IV Zosyn was given in ED. Will started cardizem gtt now. Transferred to [facility] for continuation of care   She was found to have new onset of Afib as well with HR 155 without chest pain, but with palpitation  EKG with Afib, RVR,  started on Cardizem 5 mg/ H drip at [facility]  she received morphine 1 mg IV Q4 hours PRN she is receiving IV fluids at 125 an hour  Review of Systems: As per HPI otherwise 10 point review of systems negative.   Past Medical History:  Diagnosis Date  . Diabetes (HCC)   . 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 History of nuclear stress test 2006   adenosine; normal perfusion, no fixed or reversible defects  . History of palpitations   . Hyperlipidemia   . Hypertension   . PVD (peripheral vascular disease) (HCC)     Past Surgical History:  Procedure Laterality Date  . CARDIAC CATHETERIZATION  05/2000   r/t false-positive stress test; normal coronaries - Dr. Mervyn Skeeters. [place]   . KNEE ARTHROSCOPY Right 10/2001  . ROTATOR CUFF REPAIR  1999  . TOE SURGERY Right 04/2003  . TRANSTHORACIC ECHOCARDIOGRAM  05/2010   EF=>55%, stage 1 diastolic dysfunction; mild MR; mild AVR & AV mildly sclerotic    Social History Social History   Social History  . Marital status: Widowed    Spouse name: N/A  . Number of children: 1  . Years of education: N/A   Occupational History  .  [employer]   Social History Main Topics  . Smoking status: Former Smoker    Packs/day: 1.00    Years: 20.00    Types: Cigarettes  Quit date: 03/17/1967  . Smokeless tobacco: Never Used     Comment: quit smoking in late 1960s  . Alcohol use Not on file  . Drug use: Unknown  . Sexual activity: Not on file   Other Topics Concern  . Not on file   Social History Narrative  . No narrative on file     No Known Allergies  Family History  Problem Relation Age of Onset  . Cancer Mother   . Alzheimer's disease Mother   . Lung cancer Father   . Cancer Maternal Grandmother   . Heart attack Maternal Grandfather   . Liver disease Brother     transplant  . Thyroid disease Child     thyroidectomy      Prior to Admission medications   Medication Sig Start Date End Date Taking? Authorizing Provider  calcium  citrate (CALCITRATE - DOSED IN MG ELEMENTAL CALCIUM) 950 MG tablet Take 1 tablet by mouth daily.    Historical Provider, MD  enalapril (VASOTEC) 20 MG tablet Take 20 mg by mouth daily. 02/14/13   Historical Provider, MD  magnesium oxide (MAG-OX) 400 MG tablet Take 400 mg by mouth daily.    Historical Provider, MD  metFORMIN (GLUCOPHAGE-XR) 500 MG 24 hr tablet Take 2 tablets by mouth 2 (two) times daily. 02/20/13   Historical Provider, MD  metoprolol succinate (TOPROL-XL) 100 MG 24 hr tablet Take 50 mg by mouth daily.  01/23/13   Historical Provider, MD  Multiple Vitamin (MULTIVITAMIN) capsule Take 1 capsule by mouth daily.    Historical Provider, MD  Omega-3 Fatty Acids (FISH OIL) 1000 MG CAPS Take 1 capsule by mouth daily.     Historical Provider, MD  pravastatin (PRAVACHOL) 40 MG tablet Take 1 tablet by mouth daily. 01/13/13   Historical Provider, MD  VICTOZA 18 MG/3ML SOPN Inject 1.8 mg into the skin every morning.  03/09/13   Historical Provider, MD    Physical Exam:    Vitals:   04/27/16 0645 04/27/16 0700 04/27/16 0745 04/27/16 0921  BP:  137/57 (!) 143/53 (!) 156/58  Pulse: 81 82 77 80  Resp: 20 26 21  (!) 23  Temp:    98 F (36.7 C)  TempSrc:    Oral  SpO2: 97% 99% 98% 97%  Weight:    80.2 kg (176 lb 12.9 oz)  Height:    5\' 6"  (1.676 m)       Constitutional: uncomfortable due to pain, ill appearing  Vitals:   04/27/16 0645 04/27/16 0700 04/27/16 0745 04/27/16 0921  BP:  137/57 (!) 143/53 (!) 156/58  Pulse: 81 82 77 80  Resp: 20 26 21  (!) 23  Temp:    98 F (36.7 C)  TempSrc:    Oral  SpO2: 97% 99% 98% 97%  Weight:    80.2 kg (176 lb 12.9 oz)  Height:    5\' 6"  (1.676 m)   Eyes: PERRL, lids and conjunctivae normal ENMT: Mucous membranes are moist. Posterior pharynx clear of any exudate or lesions.Normal dentition.  Neck: normal, supple, no masses, no thyromegaly Respiratory: clear to auscultation bilaterally, no wheezing, no crackles. Normal respiratory effort. No  accessory muscle use.  Cardiovascular:tachy with regular rhythm, no murmurs / rubs / gallops. No extremity edema. 2+ pedal pulses. No carotid bruits.  Abdomen: exquisite tenderness in the upper right quadrant without radiation, no masses palpated. No hepatosplenomegaly. Bowel sounds positive.   Musculoskeletal: no clubbing / cyanosis. No joint deformity upper and lower extremities. Good ROM,  no contractures. Normal muscle tone.  Skin: no rashes, lesions, ulcers. 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 Sensation intact, DTR normal. Strength 5/5 in all 4.  Psychiatric: Normal judgment and insight. Alert and oriented x 3. Anxious  mood.     Labs on Admission: I have personally reviewed following labs and imaging studies  CBC:  Recent Labs Lab 04/27/16 0307  WBC 11.6*  NEUTROABS 9.8*  HGB 11.8*  HCT 36.1  MCV 84.9  PLT 338    Basic Metabolic Panel:  Recent Labs Lab 04/27/16 0307  NA 136  K 3.8  CL 98*  CO2 25  GLUCOSE 303*  BUN 40*  CREATININE 0.91  CALCIUM 9.4    GFR: Estimated Creatinine Clearance: 56.2 mL/min (by C-G formula based on SCr of 0.91 mg/dL).  Liver Function Tests:  Recent Labs Lab 04/27/16 0307  AST 183*  ALT 180*  ALKPHOS 946*  BILITOT 1.9*  PROT 7.3  ALBUMIN 3.7    Recent Labs Lab 04/27/16 0307  LIPASE 6,436*   No results for input(s): AMMONIA in the last 168 hours.  Coagulation Profile: No results for input(s): INR, PROTIME in the last 168 hours.  Cardiac Enzymes:  Recent Labs Lab 04/27/16 0710  TROPONINI 0.03*    BNP (last 3 results) No results for input(s): PROBNP in the last 8760 hours.  HbA1C: No results for input(s): HGBA1C in the last 72 hours.  CBG:  Recent Labs Lab 04/27/16 0917  GLUCAP 271*    Lipid Profile: No results for input(s): CHOL, HDL, LDLCALC, TRIG, CHOLHDL, LDLDIRECT in the last 72 hours.  Thyroid Function Tests: No results for input(s): TSH, T4TOTAL,  FREET4, T3FREE, THYROIDAB in the last 72 hours.  Anemia Panel: No results for input(s): VITAMINB12, FOLATE, FERRITIN, TIBC, IRON, RETICCTPCT in the last 72 hours.  Urine analysis: No results found for: COLORURINE, APPEARANCEUR, LABSPEC, PHURINE, GLUCOSEU, HGBUR, BILIRUBINUR, KETONESUR, PROTEINUR, UROBILINOGEN, NITRITE, LEUKOCYTESUR  Sepsis Labs: @LABRCNTIP (procalcitonin:4,lacticidven:4) )No results found for this or any previous visit (from the past 240 hour(s)).   Radiological Exams on Admission: Ct Abdomen Pelvis W Contrast  Result Date: 04/27/2016 CLINICAL DATA:  Abdominal pain.  Vomiting. EXAM: CT ABDOMEN AND PELVIS WITH CONTRAST TECHNIQUE: Multidetector CT imaging of the abdomen and pelvis was performed using the standard protocol following bolus administration of intravenous contrast. CONTRAST:  100mL ISOVUE-300 IOPAMIDOL (ISOVUE-300) INJECTION 61% COMPARISON:  CT abdomen pelvis 08/05/2015 FINDINGS: Lower chest: No pulmonary nodules. No visible pleural or pericardial effusion. Hepatobiliary: There is intrahepatic and extrahepatic biliary dilatation. There is a 3-4 mm stone within the distal common bile duct. The common bile duct is dilated up to 12 mm. Multiple stones are also seen within the gallbladder. Pancreas: Normal pancreatic contours and enhancement. No peripancreatic fluid collection or pancreatic ductal dilatation. Spleen: Normal. Adrenals/Urinary Tract: Normal adrenal glands. No hydronephrosis or solid renal mass. Stomach/Bowel: No abnormal bowel dilatation. No bowel wall thickening or adjacent fat stranding to indicate acute inflammation. No abdominal fluid collection. The appendix is not clearly seen. Vascular/Lymphatic: There is aortic atherosclerosis. Extensive calcification of the splenic artery. No abdominal or pelvic lymphadenopathy. Reproductive: Normal uterus and ovaries. Musculoskeletal: No lytic or blastic osseous lesion. Normal visualized extrathoracic and extraperitoneal  soft tissues. Other: No contributory non-categorized findings. IMPRESSION: 1. Choledocholithiasis with severe resultant intrahepatic and extrahepatic biliary dilatation. 2. Extensive atherosclerotic calcification of the aorta, coronary arteries and abdominal arteries. 3. Cholelithiasis. Electronically Signed   By: Deatra RobinsonKevin  Herman M.D.   On:  04/27/2016 05:44    EKG: Independently reviewed.  Assessment/Plan Active Problems:   Choledocholithiasis with obstruction   Atrial fibrillation (HCC)   Palpitations   HTN (hypertension)   DM2 (diabetes mellitus, type 2) (HCC)   Hyperlipidemia  Choledocholithiasis with obstruction as evidenced by CT abdomen and pelvis.   Lipase 6436  WBC 11.6  Placed on IV Zosyn, IVF  -admit to Stepdown  Appreciate GI consult  -keep NPO until seen by GI     IVF while NPO. Continue Zosyn IV therapy while cultures are being obtained  Pain meds for management of symptoms   Atrial Fibrillation new onset, RVR, [facility] at 5 mg/h with conversion to SR . Tn 0.03 EKG SR , Cardizem drip has been discontinued  2 D echo  Cardiology to see, appreciate consult    Hyperlipidemia Continue home statins   Hypertension BP (!) 156/58 Pulse 80    Continue home anti-hypertensive medications    Type II Diabetes Current blood sugar level is 271 No results found for: HGBA1C Hgb A1C Hold home oral diabetic medications.  SSI   DVT prophylaxis: Lovenox   Code Status:   Full    Family Communication:  Discussed with patient Disposition Plan: Expect patient to be discharged to home after condition improves Consults called:  Cardiology and GI  Admission status: SDU    [facility] LLCWERTMAN,Zyanne Schumm E, PA-C [facility]   04/27/2016, 10:04 AM

## 2016-04-27 NOTE — ED Notes (Signed)
Returned from CT.

## 2016-04-27 NOTE — ED Notes (Addendum)
Patient has been consulted for a bed via [facility] when we got here this morning.

## 2016-04-27 NOTE — ED Triage Notes (Addendum)
Pt c/o upper mid abd pain that started approx 3 hours ago. Describes as dull and constant. C/o vomiting multiple times in the past couple hours. Denies any other symptoms. Skin color pale on arrival. Denies any cp/sob.  Denies any diarrhea. C/o feeling cold. Warm blankets given. MD aware of patient's complaints.

## 2016-04-27 NOTE — Progress Notes (Signed)
MD made aware of SBP 150-170s, new orders received for hydralazine with parameters and fentanyl with parameters. Will continue to monitor patient closely.

## 2016-04-27 NOTE — Progress Notes (Signed)
Need MD to see patient for arrival; notified admissions, they advised MD Melynda RippleHobbs to see patient.

## 2016-04-27 NOTE — ED Notes (Signed)
Pt back in SR 95 after cardizem IV given

## 2016-04-27 NOTE — Care Management Note (Signed)
Case Management Note  Patient Details  Name: Merceda ElksRalice Schnee MRN: [id number] Date of Birth: 09-12-1940  Subjective/Objective:    Choledocholithiasis, Gallstones, New Onset Afib                Action/Plan: Discharge Planning: NCM spoke to pt and Garnett Farmdtr, Alyssa Brown, [phone] at bedside. Dtr states she works for [employer] and [facility] wants [employer]. Pt was independent prior to hospital stay. Works part-time in [employer]. Waiting final recommendations for home.  dtr's address -[address], [place] [id number]   Doree BarthelPCP-SPRY, HEATHER M. MD  Expected Discharge Date:                 Expected Discharge Plan:  Home w Home Health Services  In-House Referral:  NA  Discharge planning Services  CM Consult  Post Acute Care Choice:  Home Health Choice offered to:  Adult Children  DME Arranged:  N/A DME Agency:  NA  HH Arranged:  RN HH Agency:  [facility]  Status of Service:  In process, will continue to follow  If discussed at Long Length of Stay Meetings, dates discussed:    Additional Comments:  Elliot CousinShavis, Norine Reddington Ellen, RN 04/27/2016, 11:34 AM

## 2016-04-27 NOTE — ED Notes (Addendum)
MD at bedside. 

## 2016-04-27 NOTE — Consult Note (Signed)
Reason for Consult: Preoperative clearance  Requesting Physician: Dr Clyde Lundborg   HPI: Rachel Wong is a 75 year old ill-appearing divorced Caucasian female mother of one daughter who is a cardiology patient of Dr. Blanchie Dessert . She has a history of hypertension, hyperlipidemia and diabetes. She has never had a heart attack or stroke. She had a normal cardiac catheterization in 2001 and nonischemic Myoview in 2006. She denies chest pain or shortness of breath. She's had abdominal pain nausea vomiting CT scan that shows choledocholithiasis with dilated intrahepatic ducts and pancreatitis. She will need ERCP plus or minus sphincterotomy. We are asked to see her for preoperative clearance.  Problem List: Patient Active Problem List   Diagnosis Date Noted  . Choledocholithiasis with obstruction 04/27/2016  . Atrial fibrillation (HCC) 04/27/2016  . Palpitations 03/16/2013  . HTN (hypertension) 03/16/2013  . DM2 (diabetes mellitus, type 2) (HCC) 03/16/2013  . Hyperlipidemia 03/16/2013    PMHx:  Past Medical History:  Diagnosis Date  . Choledocholithiasis 04/2016  . Complication of anesthesia   . Diabetes (HCC)   . 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 History of nuclear stress test 2006   adenosine; normal perfusion, no fixed or reversible defects  . History of palpitations   . Hyperlipidemia   . Hypertension   . PONV (postoperative nausea and vomiting)   . PVD (peripheral vascular disease) (HCC)    Past Surgical History:  Procedure Laterality Date  . CARDIAC CATHETERIZATION  05/2000   r/t false-positive stress test; normal coronaries - Dr. Mervyn Skeeters. [place]   . KNEE ARTHROSCOPY Right 10/2001  . ROTATOR CUFF REPAIR  1999  . TOE SURGERY Right 04/2003  . TRANSTHORACIC ECHOCARDIOGRAM  05/2010   EF=>55%, stage 1 diastolic dysfunction; mild MR; mild AVR & AV mildly sclerotic    FAMHx: Family History  Problem Relation Age of Onset  . Cancer Mother   . Alzheimer's disease Mother   . Lung  cancer Father   . Cancer Maternal Grandmother   . Heart attack Maternal Grandfather   . Liver disease Brother     transplant  . Thyroid disease Child     thyroidectomy    SOCHx:  reports that she quit smoking about 49 years ago. Her smoking use included Cigarettes. She has a 20.00 pack-year smoking history. She has never used smokeless tobacco. Her alcohol and drug histories are not on file.  ALLERGIES: No Known Allergies  ROS: Pertinent items are noted in HPI.  HOME MEDICATIONS: Prescriptions Prior to Admission  Medication Sig Dispense Refill Last Dose  . aspirin EC 81 MG tablet Take 81 mg by mouth daily.   04/25/2016  . calcium citrate (CALCITRATE - DOSED IN MG ELEMENTAL CALCIUM) 950 MG tablet Take 1 tablet by mouth daily.   04/26/2016 at Unknown time  . enalapril (VASOTEC) 20 MG tablet Take 20 mg by mouth daily.   04/26/2016 at Unknown time  . magnesium oxide (MAG-OX) 400 MG tablet Take 400 mg by mouth daily.   04/25/2016  . metFORMIN (GLUCOPHAGE-XR) 500 MG 24 hr tablet Take 2 tablets by mouth 2 (two) times daily.   04/26/2016 at Unknown time  . Multiple Vitamin (MULTIVITAMIN) capsule Take 1 capsule by mouth daily.   04/26/2016 at Unknown time  . Omega-3 Fatty Acids (FISH OIL) 1000 MG CAPS Take 1 capsule by mouth daily.    04/26/2016 at Unknown time  . pravastatin (PRAVACHOL) 40 MG tablet Take 1 tablet by mouth daily.   04/25/2016  .  VICTOZA 18 MG/3ML SOPN Inject 1.8 mg into the skin every morning.    04/26/2016 at Unknown time  . metoprolol succinate (TOPROL-XL) 100 MG 24 hr tablet Take 50 mg by mouth daily.    04/25/2016 at pm    HOSPITAL MEDICATIONS: I have reviewed the patient's current medications.  VITALS: Blood pressure (!) 171/63, pulse 78, temperature 98.7 F (37.1 C), temperature source Oral, resp. rate (!) 24, height 5\' 6"  (1.676 m), weight 176 lb 12.9 oz (80.2 kg), SpO2 96 %.  INPUT/OUTPUT No intake/output data recorded. Total I/O In: 882.3 [P.O.:120;  I.V.:712.3; IV Piggyback:50] Out: 850 [Urine:800; Emesis/NG output:50]    PHYSICAL EXAM: General appearance: alert and mild distress Neck: no adenopathy, no carotid bruit, no JVD, supple, symmetrical, trachea midline and thyroid not enlarged, symmetric, no tenderness/mass/nodules Lungs: clear to auscultation bilaterally Heart: regular rate and rhythm, S1, S2 normal, no murmur, click, rub or gallop Extremities: extremities normal, atraumatic, no cyanosis or edema  LABS:  BMP  Recent Labs  04/27/16 0307  NA 136  K 3.8  CL 98*  CO2 25  GLUCOSE 303*  BUN 40*  CREATININE 0.91  CALCIUM 9.4  GFRNONAA 60*  GFRAA >60    CBC  Recent Labs Lab 04/27/16 0307  WBC 11.6*  RBC 4.25  HGB 11.8*  HCT 36.1  PLT 338  MCV 84.9    HEMOGLOBIN A1C No results found for: HGBA1C, MPG  Cardiac Panel (last 3 results)  Recent Labs  04/27/16 0710 04/27/16 1130  TROPONINI 0.03* 0.03*    BNP (last 3 results) No results for input(s): PROBNP in the last 8760 hours.  TSH No results for input(s): TSH in the last 8760 hours.  CHOLESTEROL No results for input(s): CHOL in the last 8760 hours.  Hepatic Function Panel  Recent Labs  04/27/16 0307  PROT 7.3  ALBUMIN 3.7  AST 183*  ALT 180*  ALKPHOS 946*  BILITOT 1.9*    IMAGING: Ct Abdomen Pelvis W Contrast  Result Date: 04/27/2016 CLINICAL DATA:  Abdominal pain.  Vomiting. EXAM: CT ABDOMEN AND PELVIS WITH CONTRAST TECHNIQUE: Multidetector CT imaging of the abdomen and pelvis was performed using the standard protocol following bolus administration of intravenous contrast. CONTRAST:  100mL ISOVUE-300 IOPAMIDOL (ISOVUE-300) INJECTION 61% COMPARISON:  CT abdomen pelvis 08/05/2015 FINDINGS: Lower chest: No pulmonary nodules. No visible pleural or pericardial effusion. Hepatobiliary: There is intrahepatic and extrahepatic biliary dilatation. There is a 3-4 mm stone within the distal common bile duct. The common bile duct is dilated up  to 12 mm. Multiple stones are also seen within the gallbladder. Pancreas: Normal pancreatic contours and enhancement. No peripancreatic fluid collection or pancreatic ductal dilatation. Spleen: Normal. Adrenals/Urinary Tract: Normal adrenal glands. No hydronephrosis or solid renal mass. Stomach/Bowel: No abnormal bowel dilatation. No bowel wall thickening or adjacent fat stranding to indicate acute inflammation. No abdominal fluid collection. The appendix is not clearly seen. Vascular/Lymphatic: There is aortic atherosclerosis. Extensive calcification of the splenic artery. No abdominal or pelvic lymphadenopathy. Reproductive: Normal uterus and ovaries. Musculoskeletal: No lytic or blastic osseous lesion. Normal visualized extrathoracic and extraperitoneal soft tissues. Other: No contributory non-categorized findings. IMPRESSION: 1. Choledocholithiasis with severe resultant intrahepatic and extrahepatic biliary dilatation. 2. Extensive atherosclerotic calcification of the aorta, coronary arteries and abdominal arteries. 3. Cholelithiasis. Electronically Signed   By: Deatra RobinsonKevin  Herman M.D.   On: 04/27/2016 05:44  0  EKG- normal sinus rhythm at 79 without ST or T-wave changes. I personally reviewed this EKG  IMPRESSION: 1. Preoperative  clearance-Rachel Wong has positive cardiac risk factors within normal crit catheterization 2001 and a negative Myoview in 2006. She is asymptomatic. The CT scan that showed calcification of her aorta and coronary arteries. At this point, I do not feel functional testing is required prior to her upcoming GI procedure. She'll be cleared at low cardiovascular risk.   RECOMMENDATION: 1. Cleared for upcoming GI procedure including ERCP, sphincterotomy and/or cholecystectomy at low cardiovascular risk.  Time Spent Directly with Patient:  45 minutes  Nanetta BattyBerry, Anila Bojarski 04/27/2016, 4:15 PM

## 2016-04-27 NOTE — ED Notes (Signed)
Pt drinking po contrast and became nauseated with vomiting. States an increase in pain level to a 7/10 MD aware.

## 2016-04-27 NOTE — ED Notes (Signed)
02 sat 90% on RA 02 placed at 2l via n/c. Atrial fib 85 on monitor.

## 2016-04-27 NOTE — ED Notes (Signed)
02 sats increased to 95% on 2l via n/c

## 2016-04-27 NOTE — ED Notes (Signed)
In with patient and HR was 155 on monitor Atrial fib. MD aware and orders received. Pt does not have a hx of Atrial Fib.

## 2016-04-27 NOTE — ED Notes (Signed)
Pt vomited small amount of clear emesis

## 2016-04-27 NOTE — Progress Notes (Signed)
This is a no charge note  [facility] Read DriversMolpus  75 year old lady with past medical history of hypertension, hyperlipidemia, diabetes mellitus, PVD, who presents with upper mid abdominal pain, nausea and vomiting. Found to have choledocholithiasis with severe biliary duct dilation by CT-abd/pelvis. Abnormal liver function with ALP 946, AST 183, ALT 180, total bilirubin 1.9. Lipase 6436. Patient also has new onset of atrial fibrillation with heart rate up to 155. No chest pain per ED physician. WBC 11.6, electrolytes renal function okay, temperature normal, oxygen saturation 89-93%. IV Zosyn was given in ED. Will started cardizem gtt now. Pt is accepted to SDU as inpt. Need to call GI and Card at pt's arrival.   Lorretta HarpXilin Nikhil Osei, MD  [facility] Pager [phone]  If 7PM-7AM, please contact night-coverage [email or web address] Password [id number] 04/27/2016, 6:41 AM

## 2016-04-27 NOTE — Consult Note (Signed)
Subjective:   HPI  The patient is a 75 year old female who was admitted to the hospital after [place] with complaints of nausea and mid and upper abdominal pain. There was also associated vomiting. A CT scan was done showing dilatation of the biliary tree, gallstones, and common bile duct stones. Her labs revealed total bilirubin 1.9, alkaline phosphatase 946, ALT 180, AST 183. Serum lipase 6436. The CT scan did not show active inflammatory signs in the pancreas. She also developed new onset atrial fibrillation and was started on a Cardizem drip. Cardiology has been consult at.  The patient tells me that she knew she had gallstones as she was told this years ago in [place]. She also mentioned that she had had an episode of atrial fibrillation in the past.  Review of Systems Denies chest pain or shortness of breath  Past Medical History:  Diagnosis Date  . Diabetes (HCC)   . 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 History of nuclear stress test 2006   adenosine; normal perfusion, no fixed or reversible defects  . History of palpitations   . Hyperlipidemia   . Hypertension   . PVD (peripheral vascular disease) (HCC)    Past Surgical History:  Procedure Laterality Date  . CARDIAC CATHETERIZATION  05/2000   r/t false-positive stress test; normal coronaries - Dr. Mervyn SkeetersA. Little   . KNEE ARTHROSCOPY Right 10/2001  . ROTATOR CUFF REPAIR  1999  . TOE SURGERY Right 04/2003  . TRANSTHORACIC ECHOCARDIOGRAM  05/2010   EF=>55%, stage 1 diastolic dysfunction; mild MR; mild AVR & AV mildly sclerotic   Social History   Social History  . Marital status: Widowed    Spouse name: N/A  . Number of children: 1  . Years of education: N/A   Occupational History  .  [employer]   Social History Main Topics  . Smoking status: Former Smoker    Packs/day: 1.00    Years: 20.00    Types: Cigarettes    Quit date: 03/17/1967  . Smokeless tobacco: Never Used     Comment: quit smoking in late 1960s  . Alcohol use Not on file  . Drug use: Unknown  . Sexual activity: Not on file   Other Topics Concern  . Not on file   Social History Narrative  . No narrative on file   family history includes Alzheimer's disease in her mother; Cancer in her maternal grandmother and mother; Heart attack in her maternal grandfather; Liver disease in her brother; Lung cancer in her father; Thyroid disease in her child.  Current Facility-Administered Medications:  .  0.9 %  sodium chloride infusion, , Intravenous, Continuous, Sung AmabileSara E Wertman, PA-C .  acetaminophen (TYLENOL) tablet 650 mg, 650 mg, Oral, Q6H PRN **OR** acetaminophen (TYLENOL) suppository 650 mg, 650 mg, Rectal, Q6H PRN, Marcos EkeSara E Wertman, PA-C .  bisacodyl (DULCOLAX) suppository 10 mg, 10 mg, Rectal, Daily PRN, Marcos EkeSara E Wertman, PA-C .  diltiazem (CARDIZEM) 100 mg in dextrose 5% 100mL (1 mg/mL) infusion, 5-15 mg/hr, Intravenous, Titrated, Paula LibraJohn Molpus, MD, Last Rate: 5 mL/hr at 04/27/16 0653, 5 mg/hr at 04/27/16 0653 .  enalapril (VASOTEC) tablet 20 mg, 20 mg, Oral, Daily, Marcos EkeSara E Wertman, PA-C .  enoxaparin (LOVENOX) injection 40 mg, 40 mg, Subcutaneous, Q24H, Sara E Wertman, PA-C .  fentaNYL (SUBLIMAZE) injection 50 mcg, 50 mcg, Intravenous, Once, Haydee SalterPhillip M Hobbs, MD .  HYDROcodone-acetaminophen (NORCO/VICODIN) 5-325 MG per tablet 1-2 tablet, 1-2  tablet, Oral, Q4H PRN, Marcos EkeSara E Wertman, PA-C .  insulin aspart (novoLOG) injection 0-9 Units, 0-9 Units, Subcutaneous, TID WC, Sung AmabileSara E Wertman, PA-C .  magnesium citrate solution 1 Bottle, 1 Bottle, Oral, Once PRN, Marcos EkeSara E Wertman, PA-C .  metoprolol succinate (TOPROL-XL) 24 hr tablet 50 mg, 50 mg, Oral, Daily, Marcos EkeSara E Wertman, PA-C .  morphine 2 MG/ML injection 1 mg, 1 mg, Intravenous, Q4H PRN, Lorretta HarpXilin Niu, MD, 1 mg at 04/27/16 0752 .  ondansetron (ZOFRAN) tablet 4 mg, 4 mg, Oral, Q6H PRN **OR** ondansetron (ZOFRAN) injection 4 mg, 4 mg, Intravenous, Q6H PRN, Marcos EkeSara E Wertman,  PA-C .  piperacillin-tazobactam (ZOSYN) IVPB 3.375 g, 3.375 g, Intravenous, Q8H, Bertram MillardMichael A Maccia, RPH .  pravastatin (PRAVACHOL) tablet 40 mg, 40 mg, Oral, Daily, Marcos EkeSara E Wertman, PA-C .  senna-docusate (Senokot-S) tablet 1 tablet, 1 tablet, Oral, QHS PRN, Marcos EkeSara E Wertman, PA-C .  sodium chloride flush (NS) 0.9 % injection 3 mL, 3 mL, Intravenous, Q12H, Sung AmabileSara E Wertman, PA-C, 3 mL at 04/27/16 1015 .  traZODone (DESYREL) tablet 25 mg, 25 mg, Oral, QHS PRN, Marcos EkeSara E Wertman, PA-C No Known Allergies   Objective:     BP (!) 156/58 (BP Location: Left Arm)   Pulse 80   Temp 98 F (36.7 C) (Oral)   Resp (!) 23   Ht 5\' 6"  (1.676 m)   Wt 80.2 kg (176 lb 12.9 oz)   SpO2 97%   BMI 28.54 kg/m   No acute distress  Nonicteric  Heart regular rhythm no murmurs  Lungs clear  Abdomen: Bowel sounds present, soft, tenderness in the epigastrium but no rebound or guarding  Laboratory No components found for: D1    Assessment:     #1. Gallstones  #2. Choledocholithiasis  #3. Gallstone pancreatitis characterized by elevated lipase  #4. New onset atrial fibrillation      Plan:     Continue supportive care for now. Continue with antibiotic therapy for now. Cardiology consult. She will need ERCP with sphincterotomy and stone extraction after she settles down from her pancreatitis and we received cardiac clearance. After ERCP would recommend asking for a surgical consult for cholecystectomy. We will follow.

## 2016-04-27 NOTE — Progress Notes (Signed)
Pharmacy Antibiotic Note  Rachel Wong is a 75 y.o. female admitted on 04/27/2016 with intra-abdominal infection.  Pharmacy has been consulted for Zosyn dosing.  Plan: Zosyn 3.375g IV q8h (4 hour infusion).  Height: 5\' 6"  (167.6 cm) Weight: 176 lb 12.9 oz (80.2 kg) IBW/kg (Calculated) : 59.3  Temp (24hrs), Avg:98 F (36.7 C), Min:97.9 F (36.6 C), Max:98 F (36.7 C)   Recent Labs Lab 04/27/16 0307 04/27/16 0717  WBC 11.6*  --   CREATININE 0.91  --   LATICACIDVEN  --  1.25    Estimated Creatinine Clearance: 56.2 mL/min (by C-G formula based on SCr of 0.91 mg/dL).    No Known Allergies  Isaac BlissMichael Delontae Lamm, PharmD, BCPS, [facility] Clinical Pharmacist Phone [phone] 04/27/2016 10:18 AM

## 2016-04-27 NOTE — ED Notes (Signed)
To CT scan

## 2016-04-27 NOTE — ED Notes (Signed)
Report to next shift

## 2016-04-27 NOTE — Progress Notes (Signed)
MD Melynda RippleHobbs paged, patient has 8/10 abdominal pain, no PRNs to give. MD made aware that patient has converted to NSR. New verbal orders received for once 50mcg fentanyl, EKG and discontinuation of cardizem gtt if EKG confirms NSR.

## 2016-04-27 NOTE — ED Provider Notes (Signed)
[facility] Provider Note: Lowella Dell, MD, FACEP  CSN: [id number] MRN: [id number] ARRIVAL: 04/27/16 at 0235 ROOM: [facility]   CHIEF COMPLAINT  Vomiting   HISTORY OF PRESENT ILLNESS  Rachel Wong is a 75 y.o. female who developed epigastric abdominal pain about midnight. She describes the pain as dull and achy. It is constant and moderate in severity. It is worse with movement or palpation. She subsequently developed nausea and vomiting and has vomited about 8 times. She has not had diarrhea. Her stools have been harder than usual for the past 2 days. She has not had a fever. She denies urinary changes, chest pain or shortness of breath.   Past Medical History:  Diagnosis Date  . Diabetes (HCC)   . 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 History of nuclear stress test 2006   adenosine; normal perfusion, no fixed or reversible defects  . History of palpitations   . Hyperlipidemia   . Hypertension   . PVD (peripheral vascular disease) (HCC)     Past Surgical History:  Procedure Laterality Date  . CARDIAC CATHETERIZATION  05/2000   r/t false-positive stress test; normal coronaries - Dr. Mervyn Skeeters. [facility]   . KNEE ARTHROSCOPY Right 10/2001  . ROTATOR CUFF REPAIR  1999  . TOE SURGERY Right 04/2003  . TRANSTHORACIC ECHOCARDIOGRAM  05/2010   EF=>55%, stage 1 diastolic dysfunction; mild MR; mild AVR & AV mildly sclerotic    Family History  Problem Relation Age of Onset  . Cancer Mother   . Alzheimer's disease Mother   . Lung cancer Father   . Cancer Maternal Grandmother   . Heart attack Maternal Grandfather   . Liver disease Brother     transplant  . Thyroid disease Child     thyroidectomy    Social History  Substance Use Topics  . Smoking status: Former Smoker    Packs/day: 1.00    Years: 20.00    Types: Cigarettes    Quit date: 03/17/1967  . Smokeless tobacco: Never Used     Comment: quit smoking in late 1960s  . Alcohol use Not on file    Prior to  Admission medications   Medication Sig Start Date End Date Taking? Authorizing Provider  calcium citrate (CALCITRATE - DOSED IN MG ELEMENTAL CALCIUM) 950 MG tablet Take 1 tablet by mouth daily.    Historical Provider, MD  enalapril (VASOTEC) 20 MG tablet Take 20 mg by mouth daily. 02/14/13   Historical Provider, MD  magnesium oxide (MAG-OX) 400 MG tablet Take 400 mg by mouth daily.    Historical Provider, MD  metFORMIN (GLUCOPHAGE-XR) 500 MG 24 hr tablet Take 2 tablets by mouth 2 (two) times daily. 02/20/13   Historical Provider, MD  metoprolol succinate (TOPROL-XL) 100 MG 24 hr tablet Take 50 mg by mouth daily.  01/23/13   Historical Provider, MD  Multiple Vitamin (MULTIVITAMIN) capsule Take 1 capsule by mouth daily.    Historical Provider, MD  Omega-3 Fatty Acids (FISH OIL) 1000 MG CAPS Take 1 capsule by mouth daily.     Historical Provider, MD  pravastatin (PRAVACHOL) 40 MG tablet Take 1 tablet by mouth daily. 01/13/13   Historical Provider, MD  VICTOZA 18 MG/3ML SOPN Inject 1.8 mg into the skin every morning.  03/09/13   Historical Provider, MD    Allergies Patient has no known allergies.   REVIEW OF SYSTEMS  Negative except as noted here or in the History of Present Illness.  PHYSICAL EXAMINATION  Initial Vital Signs Blood pressure 90/62, pulse 87, temperature 97.9 F (36.6 C), temperature source Oral, resp. rate 20, height 5\' 6"  (1.676 m), weight 175 lb (79.4 kg), SpO2 97 %.  Examination General: Well-developed, well-nourished female in no acute distress; appearance consistent with age of record HENT: normocephalic; atraumatic Eyes: pupils equal, round and reactive to light; extraocular muscles intact Neck: supple Heart: regular rate and rhythm; no murmur; occasional PACs and sinus pauses Lungs: clear to auscultation bilaterally Abdomen: soft; nondistended; Epigastric and left upper quadrant tenderness; no masses or hepatosplenomegaly; bowel sounds present Extremities: No deformity;  full range of motion; pulses normal Neurologic: Awake, alert and oriented; motor function intact in all extremities and symmetric; no facial droop Skin: Warm and dry; pale Psychiatric: Flat affect   RESULTS  Summary of this visit's results, reviewed by myself:   EKG Interpretation  Date/Time:  Monday April 27 2016 03:09:27 EST Ventricular Rate:  78 PR Interval:    QRS Duration: 92 QT Interval:  390 QTC Calculation: 445 R Axis:   79 Text Interpretation:  Sinus rhythm Normal ECG No previous ECGs available Confirmed by Elanor Cale  MD, Jonny RuizJOHN ([id number]) on 04/27/2016 3:20:49 AM       EKG Interpretation  Date/Time:  Monday April 27 2016 05:35:29 EST Ventricular Rate:  153 PR Interval:    QRS Duration: 86 QT Interval:  261 QTC Calculation: 417 R Axis:   84 Text Interpretation:  Atrial fibrillation with rapid V-rate Borderline right axis deviation Repolarization abnormality, prob rate related Previously NSR Confirmed by Read DriversMOLPUS  MD, Jonny RuizJOHN ([id number]) on 04/27/2016 5:46:11 AM       EKG Interpretation  Date/Time:  Monday April 27 2016 05:56:30 EST Ventricular Rate:  98 PR Interval:    QRS Duration: 90 QT Interval:  372 QTC Calculation: 451 R Axis:   82 Text Interpretation:  Atrial fibrillation Borderline right axis deviation Minimal ST depression, diffuse leads Rate is slower Confirmed by Dominyk Law  MD, Jonny RuizJOHN ([id number]) on 04/27/2016 6:04:16 AM       Laboratory Studies: Results for orders placed or performed during the hospital encounter of 04/27/16 (from the past 24 hour(s))  Lipase, blood     Status: Abnormal   Collection Time: 04/27/16  3:07 AM  Result Value Ref Range   Lipase 6,436 (H) 11 - 51 U/L  Comprehensive metabolic panel     Status: Abnormal   Collection Time: 04/27/16  3:07 AM  Result Value Ref Range   Sodium 136 135 - 145 mmol/L   Potassium 3.8 3.5 - 5.1 mmol/L   Chloride 98 (L) 101 - 111 mmol/L   CO2 25 22 - 32 mmol/L   Glucose, Bld 303 (H) 65 - 99 mg/dL   BUN  40 (H) 6 - 20 mg/dL   Creatinine, Ser 5.780.91 0.44 - 1.00 mg/dL   Calcium 9.4 8.9 - 46.910.3 mg/dL   Total Protein 7.3 6.5 - 8.1 g/dL   Albumin 3.7 3.5 - 5.0 g/dL   AST 629183 (H) 15 - 41 U/L   ALT 180 (H) 14 - 54 U/L   Alkaline Phosphatase 946 (H) 38 - 126 U/L   Total Bilirubin 1.9 (H) 0.3 - 1.2 mg/dL   GFR calc non Af Amer 60 (L) >60 mL/min   GFR calc Af Amer >60 >60 mL/min   Anion gap 13 5 - 15  CBC with Differential/Platelet     Status: Abnormal   Collection Time: 04/27/16  3:07 AM  Result Value Ref Range  WBC 11.6 (H) 4.0 - 10.5 K/uL   RBC 4.25 3.87 - 5.11 MIL/uL   Hemoglobin 11.8 (L) 12.0 - 15.0 g/dL   HCT 16.136.1 09.636.0 - 04.546.0 %   MCV 84.9 78.0 - 100.0 fL   MCH 27.8 26.0 - 34.0 pg   MCHC 32.7 30.0 - 36.0 g/dL   RDW 40.913.3 81.111.5 - 91.415.5 %   Platelets 338 150 - 400 K/uL   Neutrophils Relative % 84 %   Neutro Abs 9.8 (H) 1.7 - 7.7 K/uL   Lymphocytes Relative 10 %   Lymphs Abs 1.2 0.7 - 4.0 K/uL   Monocytes Relative 6 %   Monocytes Absolute 0.7 0.1 - 1.0 K/uL   Eosinophils Relative 0 %   Eosinophils Absolute 0.0 0.0 - 0.7 K/uL   Basophils Relative 0 %   Basophils Absolute 0.0 0.0 - 0.1 K/uL   Imaging Studies: Ct Abdomen Pelvis W Contrast  Result Date: 04/27/2016 CLINICAL DATA:  Abdominal pain.  Vomiting. EXAM: CT ABDOMEN AND PELVIS WITH CONTRAST TECHNIQUE: Multidetector CT imaging of the abdomen and pelvis was performed using the standard protocol following bolus administration of intravenous contrast. CONTRAST:  100mL ISOVUE-300 IOPAMIDOL (ISOVUE-300) INJECTION 61% COMPARISON:  CT abdomen pelvis 08/05/2015 FINDINGS: Lower chest: No pulmonary nodules. No visible pleural or pericardial effusion. Hepatobiliary: There is intrahepatic and extrahepatic biliary dilatation. There is a 3-4 mm stone within the distal common bile duct. The common bile duct is dilated up to 12 mm. Multiple stones are also seen within the gallbladder. Pancreas: Normal pancreatic contours and enhancement. No  peripancreatic fluid collection or pancreatic ductal dilatation. Spleen: Normal. Adrenals/Urinary Tract: Normal adrenal glands. No hydronephrosis or solid renal mass. Stomach/Bowel: No abnormal bowel dilatation. No bowel wall thickening or adjacent fat stranding to indicate acute inflammation. No abdominal fluid collection. The appendix is not clearly seen. Vascular/Lymphatic: There is aortic atherosclerosis. Extensive calcification of the splenic artery. No abdominal or pelvic lymphadenopathy. Reproductive: Normal uterus and ovaries. Musculoskeletal: No lytic or blastic osseous lesion. Normal visualized extrathoracic and extraperitoneal soft tissues. Other: No contributory non-categorized findings. IMPRESSION: 1. Choledocholithiasis with severe resultant intrahepatic and extrahepatic biliary dilatation. 2. Extensive atherosclerotic calcification of the aorta, coronary arteries and abdominal arteries. 3. Cholelithiasis. Electronically Signed   By: Deatra RobinsonKevin  Herman M.D.   On: 04/27/2016 05:44    ED COURSE  Nursing notes and initial vitals signs, including pulse oximetry, reviewed.  Vitals:   04/27/16 0430 04/27/16 0445 04/27/16 0500 04/27/16 0530  BP: (!) 160/49 (!) 158/47 (!) 141/49 152/71  Pulse: 82 88 84 (!) 155  Resp: 22 21 21 23   Temp:      TempSrc:      SpO2: 93% 93% 90% 93%  Weight:      Height:       5:40 AM Patient now in nature fibrillation with rapid ventricular response. Cardizem 20 milligrams IV ordered.  6:05 AM Rate improved with IV Cardizem.  6:11 AM Zosyn IV ordered for possible cholangitis.   6:35 AM Dr. Eulas PostNiu [facility] He requests a Cardizem drip be started.  PROCEDURES   CRITICAL CARE Performed by: Paula LibraMOLPUS,Brayleigh Rybacki L Total critical care time: 35 minutes Critical care time was exclusive of separately billable procedures and treating other patients. Critical care was necessary to treat or prevent imminent or  life-threatening deterioration. Critical care was time spent personally by me on the following activities: development of treatment plan with patient and/or surrogate as well as nursing, discussions  with consultants, evaluation of patient's response to treatment, examination of patient, obtaining history from patient or surrogate, ordering and performing treatments and interventions, ordering and review of laboratory studies, ordering and review of radiographic studies, pulse oximetry and re-evaluation of patient's condition.   ED DIAGNOSES     ICD-9-CM ICD-10-CM   1. Calculus of gallbladder and bile duct with obstruction without cholecystitis 574.91 K80.71   2. Pancreatitis due to common bile duct stone 577.0 K85.90     K80.50   3. Atrial fibrillation with rapid ventricular response (HCC) 427.31 I48.91   4. Atherosclerotic vascular disease 440.9 I70.90        Paula Libra, MD 04/27/16 0700

## 2016-04-27 NOTE — ED Notes (Signed)
[facility] is aware of bed---will be in route to transport patient shortly.

## 2016-04-27 NOTE — ED Notes (Signed)
In room with patient. HR was elevated to

## 2016-04-27 NOTE — ED Notes (Signed)
Pt remains atrial fib on the monitor 83. Denies nausea at present. Resting comfortably

## 2016-04-28 ENCOUNTER — Other Ambulatory Visit (HOSPITAL_COMMUNITY): Payer: BC Managed Care – PPO

## 2016-04-28 ENCOUNTER — Inpatient Hospital Stay (HOSPITAL_COMMUNITY): Payer: Medicare Other

## 2016-04-28 DIAGNOSIS — IMO0002 Reserved for concepts with insufficient information to code with codable children: Secondary | ICD-10-CM

## 2016-04-28 DIAGNOSIS — K859 Acute pancreatitis without necrosis or infection, unspecified: Secondary | ICD-10-CM

## 2016-04-28 DIAGNOSIS — E784 Other hyperlipidemia: Secondary | ICD-10-CM

## 2016-04-28 DIAGNOSIS — E118 Type 2 diabetes mellitus with unspecified complications: Secondary | ICD-10-CM

## 2016-04-28 DIAGNOSIS — K8050 Calculus of bile duct without cholangitis or cholecystitis without obstruction: Secondary | ICD-10-CM

## 2016-04-28 DIAGNOSIS — I4891 Unspecified atrial fibrillation: Secondary | ICD-10-CM

## 2016-04-28 DIAGNOSIS — E1165 Type 2 diabetes mellitus with hyperglycemia: Secondary | ICD-10-CM

## 2016-04-28 LAB — COMPREHENSIVE METABOLIC PANEL
ALBUMIN: 2.5 g/dL — AB (ref 3.5–5.0)
ALT: 172 U/L — ABNORMAL HIGH (ref 14–54)
ANION GAP: 11 (ref 5–15)
AST: 164 U/L — AB (ref 15–41)
Alkaline Phosphatase: 674 U/L — ABNORMAL HIGH (ref 38–126)
BUN: 28 mg/dL — ABNORMAL HIGH (ref 6–20)
CO2: 25 mmol/L (ref 22–32)
Calcium: 8.2 mg/dL — ABNORMAL LOW (ref 8.9–10.3)
Chloride: 105 mmol/L (ref 101–111)
Creatinine, Ser: 1.18 mg/dL — ABNORMAL HIGH (ref 0.44–1.00)
GFR calc Af Amer: 51 mL/min — ABNORMAL LOW (ref >60)
GFR calc non Af Amer: 44 mL/min — ABNORMAL LOW (ref >60)
GLUCOSE: 178 mg/dL — AB (ref 65–99)
POTASSIUM: 4.3 mmol/L (ref 3.5–5.1)
SODIUM: 141 mmol/L (ref 135–145)
Total Bilirubin: 4.1 mg/dL — ABNORMAL HIGH (ref 0.3–1.2)
Total Protein: 5.5 g/dL — ABNORMAL LOW (ref 6.5–8.1)

## 2016-04-28 LAB — PROTIME-INR
INR: 1.42
Prothrombin Time: 17.5 s — ABNORMAL HIGH (ref 11.4–15.2)

## 2016-04-28 LAB — CBC
HCT: 31 % — ABNORMAL LOW (ref 36.0–46.0)
Hemoglobin: 10.1 g/dL — ABNORMAL LOW (ref 12.0–15.0)
MCH: 27.4 pg (ref 26.0–34.0)
MCHC: 32.6 g/dL (ref 30.0–36.0)
MCV: 84 fL (ref 78.0–100.0)
Platelets: 266 K/uL (ref 150–400)
RBC: 3.69 MIL/uL — ABNORMAL LOW (ref 3.87–5.11)
RDW: 14 % (ref 11.5–15.5)
WBC: 12.4 K/uL — ABNORMAL HIGH (ref 4.0–10.5)

## 2016-04-28 LAB — GLUCOSE, CAPILLARY
GLUCOSE-CAPILLARY: 122 mg/dL — AB (ref 65–99)
GLUCOSE-CAPILLARY: 106 mg/dL — AB (ref 65–99)
Glucose-Capillary: 150 mg/dL — ABNORMAL HIGH (ref 65–99)
Glucose-Capillary: 162 mg/dL — ABNORMAL HIGH (ref 65–99)
Glucose-Capillary: 144 mg/dL — ABNORMAL HIGH (ref 65–99)
Glucose-Capillary: 139 mg/dL — ABNORMAL HIGH (ref 65–99)

## 2016-04-28 LAB — HEMOGLOBIN A1C
HEMOGLOBIN A1C: 7.5 % — AB (ref 4.8–5.6)
MEAN PLASMA GLUCOSE: 169 mg/dL

## 2016-04-28 LAB — URINE CULTURE

## 2016-04-28 MED ORDER — ENOXAPARIN SODIUM 40 MG/0.4ML ~~LOC~~ SOLN
40.0000 mg | SUBCUTANEOUS | Status: DC
  Administered 2016-04-28 – 2016-05-04 (×5): 40 mg via SUBCUTANEOUS
  Filled 2016-04-28 (×6): qty 0.4

## 2016-04-28 MED ORDER — DILTIAZEM HCL 100 MG IV SOLR
5.0000 mg/h | INTRAVENOUS | Status: DC
  Administered 2016-04-28: 5 mg/h via INTRAVENOUS
  Filled 2016-04-28 (×2): qty 100

## 2016-04-28 NOTE — Progress Notes (Signed)
PROGRESS NOTE    Rachel Wong  LKG:[id number] DOB: August 12, 1940 DOA: 04/27/2016 PCP: Raynelle Jan., MD   Brief Narrative:   75 y.o. WF PMHx HTN, PVD, HLD, Diabetes type 2 Uncontrolled with complications, Diabetic ulcer of left great toe   Presenting to [facility] with acute mid abdominal pain, nausea and  Non bloody vomiting since 2 am.  She was in her usual state of health until 2 days prior, developing mild nausea and vomiting, quickly resolving, without any recurrence until early a.m, accompanied by severe pain. Denies diarrhea. PAin does not extend to any surrounding areas. Pain at this time is 8/10 .  Denies any food poisoning . No recent trips. Denies fevers, chills, night sweats, vision changes, or mucositis. Denies any respiratory complaints. Denies any chest pain , but does have a history of intermitent palpitations, not seen by Cards for at least 1 yr. Denies lower extremity swelling.  Denies any dysuria. Denies abnormal skin rashes, or neuropathy.  She was treated with antibiotics until 2 days ago for diabetic ulcer at the first metatarsal (right) area,which iis now healed. Denies any bleeding issues such as epistaxis, ematuria or hematochezia. Ambulating without difficulty.    Subjective: 11/21 A/O 4, NAD. Patient states pain and nausea currently addressed by Zofran and pain medication. States negative previous episodes of stones.     Assessment & Plan:   Active Problems:   Palpitations   HTN (hypertension)   DM2 (diabetes mellitus, type 2) (HCC)   Hyperlipidemia   Choledocholithiasis with obstruction   Atrial fibrillation (HCC)   Choledocholithiasis   Atrial fibrillation with rapid ventricular response (HCC)   Pancreatitis due to common bile duct stone   Uncontrolled type 2 diabetes mellitus with complication (HCC)   Choledocholithiasis with obstruction as evidenced by CT abdomen and pelvis.   -Lipase 6436  WBC 11.6  -Continue current antibiotics. -Normal saline 100  ml/hr  Atrial Fibrillation new onset,  -Currently in NSR -Echocardiogram pending -Cardizem drip -Enalapril 20 mg daily -Hydralazine PRN - Metoprolol 50 mg daily  HTN  -See A. fib  Hyperlipidemia -Pravastatin 40 mg daily   Type II Diabetes uncontrolled with complication -11/20 Hemoglobin A1c= 7.5 -Sensitive SSI     DVT prophylaxis: Lovenox Code Status: Full Family Communication: None Disposition Plan:    Consultants:  Dr.Salem F Bing Matter GI Dr.Jonathan J Berry [facility]     Procedures/Significant Events:     VENTILATOR SETTINGS:    Cultures   Antimicrobials: Zosyn 7/20>>   Devices    LINES / TUBES:      Continuous Infusions: . sodium chloride 100 mL/hr at 04/28/16 0308  . diltiazem (CARDIZEM) infusion       Objective: Vitals:   04/28/16 0110 04/28/16 0455 04/28/16 0757 04/28/16 0813  BP: (!) 121/55 (!) 118/49 (!) 127/52   Pulse: 70 72 71   Resp: 15 (!) 21 16   Temp: 98.1 F (36.7 C) 98.7 F (37.1 C) 97.7 F (36.5 C)   TempSrc:  Oral Oral   SpO2: 91% 90% 98%   Weight:    83.3 kg (183 lb 10.3 oz)  Height:    5\' 6"  (1.676 m)    Intake/Output Summary (Last 24 hours) at 04/28/16 1154 Last data filed at 04/28/16 1056  Gross per 24 hour  Intake          2605.25 ml  Output              550 ml  Net  2055.25 ml   Filed Weights   04/27/16 0255 04/27/16 0921 04/28/16 0813  Weight: 79.4 kg (175 lb) 80.2 kg (176 lb 12.9 oz) 83.3 kg (183 lb 10.3 oz)    Examination:  General: A/4, NAD, No acute respiratory distress Eyes: negative scleral hemorrhage, negative anisocoria, negative icterus ENT: Negative Runny nose, negative gingival bleeding, Neck:  Negative scars, masses, torticollis, lymphadenopathy, JVD Lungs: Clear to auscultation bilaterally without wheezes or crackles Cardiovascular: Regular rate and rhythm without murmur gallop or rub normal S1 and S2 Abdomen: negative abdominal pain, nondistended, positive soft, bowel  sounds, no rebound, no ascites, no appreciable mass Extremities: No significant cyanosis, clubbing, or edema bilateral lower extremities Skin: Negative rashes, lesions, ulcers Psychiatric:  Negative depression, negative anxiety, negative fatigue, negative mania  Central nervous system:  Cranial nerves II through XII intact, tongue/uvula midline, all extremities muscle strength 5/5, sensation intact throughout, negative dysarthria, negative expressive aphasia, negative receptive aphasia.  .     Data Reviewed: Care during the described time interval was provided by me .  I have reviewed this patient's available data, including medical history, events of note, physical examination, and all test results as part of my evaluation. I have personally reviewed and interpreted all radiology studies.  CBC:  Recent Labs Lab 04/27/16 0307 04/28/16 0211  WBC 11.6* 12.4*  NEUTROABS 9.8*  --   HGB 11.8* 10.1*  HCT 36.1 31.0*  MCV 84.9 84.0  PLT 338 266   Basic Metabolic Panel:  Recent Labs Lab 04/27/16 0307 04/28/16 0211  NA 136 141  K 3.8 4.3  CL 98* 105  CO2 25 25  GLUCOSE 303* 178*  BUN 40* 28*  CREATININE 0.91 1.18*  CALCIUM 9.4 8.2*   GFR: Estimated Creatinine Clearance: 44.1 mL/min (by C-G formula based on SCr of 1.18 mg/dL (H)). Liver Function Tests:  Recent Labs Lab 04/27/16 0307 04/28/16 0211  AST 183* 164*  ALT 180* 172*  ALKPHOS 946* 674*  BILITOT 1.9* 4.1*  PROT 7.3 5.5*  ALBUMIN 3.7 2.5*    Recent Labs Lab 04/27/16 0307  LIPASE 6,436*   No results for input(s): AMMONIA in the last 168 hours. Coagulation Profile:  Recent Labs Lab 04/28/16 0211  INR 1.42   Cardiac Enzymes:  Recent Labs Lab 04/27/16 0710 04/27/16 1130  TROPONINI 0.03* 0.03*   BNP (last 3 results) No results for input(s): PROBNP in the last 8760 hours. HbA1C:  Recent Labs  04/27/16 1058  HGBA1C 7.5*   CBG:  Recent Labs Lab 04/27/16 1630 04/27/16 2026 04/28/16 0124  04/28/16 0458 04/28/16 0756  GLUCAP 189* 173* 150* 162* 144*   Lipid Profile: No results for input(s): CHOL, HDL, LDLCALC, TRIG, CHOLHDL, LDLDIRECT in the last 72 hours. Thyroid Function Tests: No results for input(s): TSH, T4TOTAL, FREET4, T3FREE, THYROIDAB in the last 72 hours. Anemia Panel: No results for input(s): VITAMINB12, FOLATE, FERRITIN, TIBC, IRON, RETICCTPCT in the last 72 hours. Urine analysis: No results found for: COLORURINE, APPEARANCEUR, LABSPEC, PHURINE, GLUCOSEU, HGBUR, BILIRUBINUR, KETONESUR, PROTEINUR, UROBILINOGEN, NITRITE, LEUKOCYTESUR Sepsis Labs: @LABRCNTIP (procalcitonin:4,lacticidven:4)  ) Recent Results (from the past 240 hour(s))  MRSA PCR Screening     Status: None   Collection Time: 04/27/16  9:52 AM  Result Value Ref Range Status   MRSA by PCR NEGATIVE NEGATIVE Final    Comment:        The GeneXpert MRSA Assay (FDA approved for NASAL specimens only), is one component of a comprehensive MRSA colonization surveillance program. It is not intended to  diagnose MRSA infection nor to guide or monitor treatment for MRSA infections.   Urine culture     Status: Abnormal   Collection Time: 04/27/16 10:17 AM  Result Value Ref Range Status   Specimen Description URINE, CLEAN CATCH  Final   Special Requests NONE  Final   Culture MULTIPLE SPECIES PRESENT, SUGGEST RECOLLECTION (A)  Final   Report Status 04/28/2016 FINAL  Final  Culture, blood (Routine X 2) w Reflex to ID Panel     Status: None (Preliminary result)   Collection Time: 04/27/16 10:48 AM  Result Value Ref Range Status   Specimen Description BLOOD LEFT ANTECUBITAL  Final   Special Requests BOTTLES DRAWN AEROBIC ONLY 5CC  Final   Culture NO GROWTH < 24 HOURS  Final   Report Status PENDING  Incomplete  Culture, blood (Routine X 2) w Reflex to ID Panel     Status: None (Preliminary result)   Collection Time: 04/27/16 10:58 AM  Result Value Ref Range Status   Specimen Description BLOOD BLOOD  LEFT ARM  Final   Special Requests BOTTLES DRAWN AEROBIC ONLY 5CC  Final   Culture NO GROWTH < 24 HOURS  Final   Report Status PENDING  Incomplete         Radiology Studies: X-ray Chest Pa And Lateral  Result Date: 04/28/2016 CLINICAL DATA:  Choledocholithiasis, preoperative assessment EXAM: CHEST  2 VIEW COMPARISON:  08/15/2015 FINDINGS: Normal heart size, mediastinal contours, and pulmonary vascularity. Peribronchial thickening with bibasilar atelectasis RIGHT greater than LEFT. Upper lungs clear. No pleural effusion or pneumothorax. Bones demineralized with chronic RIGHT rotator cuff tear. IMPRESSION: Mild bronchitic changes with bibasilar atelectasis RIGHT greater than LEFT. Electronically Signed   By: Ulyses SouthwardMark  Boles M.D.   On: 04/28/2016 07:58   Ct Abdomen Pelvis W Contrast  Result Date: 04/27/2016 CLINICAL DATA:  Abdominal pain.  Vomiting. EXAM: CT ABDOMEN AND PELVIS WITH CONTRAST TECHNIQUE: Multidetector CT imaging of the abdomen and pelvis was performed using the standard protocol following bolus administration of intravenous contrast. CONTRAST:  100mL ISOVUE-300 IOPAMIDOL (ISOVUE-300) INJECTION 61% COMPARISON:  CT abdomen pelvis 08/05/2015 FINDINGS: Lower chest: No pulmonary nodules. No visible pleural or pericardial effusion. Hepatobiliary: There is intrahepatic and extrahepatic biliary dilatation. There is a 3-4 mm stone within the distal common bile duct. The common bile duct is dilated up to 12 mm. Multiple stones are also seen within the gallbladder. Pancreas: Normal pancreatic contours and enhancement. No peripancreatic fluid collection or pancreatic ductal dilatation. Spleen: Normal. Adrenals/Urinary Tract: Normal adrenal glands. No hydronephrosis or solid renal mass. Stomach/Bowel: No abnormal bowel dilatation. No bowel wall thickening or adjacent fat stranding to indicate acute inflammation. No abdominal fluid collection. The appendix is not clearly seen. Vascular/Lymphatic: There  is aortic atherosclerosis. Extensive calcification of the splenic artery. No abdominal or pelvic lymphadenopathy. Reproductive: Normal uterus and ovaries. Musculoskeletal: No lytic or blastic osseous lesion. Normal visualized extrathoracic and extraperitoneal soft tissues. Other: No contributory non-categorized findings. IMPRESSION: 1. Choledocholithiasis with severe resultant intrahepatic and extrahepatic biliary dilatation. 2. Extensive atherosclerotic calcification of the aorta, coronary arteries and abdominal arteries. 3. Cholelithiasis. Electronically Signed   By: Deatra RobinsonKevin  Herman M.D.   On: 04/27/2016 05:44        Scheduled Meds: . enalapril  20 mg Oral Daily  . enoxaparin (LOVENOX) injection  40 mg Subcutaneous Q24H  . insulin aspart  0-9 Units Subcutaneous TID WC  . metoprolol succinate  50 mg Oral Daily  . piperacillin-tazobactam (ZOSYN)  IV  3.375 g  Intravenous Q8H  . pravastatin  40 mg Oral Daily  . sodium chloride flush  3 mL Intravenous Q12H   Continuous Infusions: . sodium chloride 100 mL/hr at 04/28/16 0308  . diltiazem (CARDIZEM) infusion       LOS: 1 day    Time spent: 40 minutes    WOODS, Roselind Messier, MD [facility] Pager [phone]   If 7PM-7AM, please contact night-coverage [email or web address] Password [facility] 04/28/2016, 11:54 AM

## 2016-04-28 NOTE — Progress Notes (Signed)
[facility] Progress Note  Subjective: The patient feels good today. She denies abdominal pain, nausea or vomiting. She has been seen by cardiology and cleared for ERCP.  Objective: Vital signs in last 24 hours: Temp:  [97.7 F (36.5 C)-99.1 F (37.3 C)] 97.7 F (36.5 C) (11/21 0757) Pulse Rate:  [69-85] 71 (11/21 0757) Resp:  [15-25] 16 (11/21 0757) BP: (115-171)/(46-73) 127/52 (11/21 0757) SpO2:  [90 %-98 %] 98 % (11/21 0757) Weight:  [80.2 kg (176 lb 12.9 oz)-83.3 kg (183 lb 10.3 oz)] 83.3 kg (183 lb 10.3 oz) (11/21 0813) Weight change: 0.82 kg (1 lb 12.9 oz)   PE:  No distress  Heart regular rhythm  Lungs clear  Abdomen soft and nontender  There has been some elevation in her bilirubin with a slight drop in alkaline phosphatase.  Lab Results: Results for orders placed or performed during the hospital encounter of 04/27/16 (from the past 24 hour(s))  Glucose, capillary     Status: Abnormal   Collection Time: 04/27/16  9:17 AM  Result Value Ref Range   Glucose-Capillary 271 (H) 65 - 99 mg/dL  MRSA PCR Screening     Status: None   Collection Time: 04/27/16  9:52 AM  Result Value Ref Range   MRSA by PCR NEGATIVE NEGATIVE  Hemoglobin A1c     Status: Abnormal   Collection Time: 04/27/16 10:58 AM  Result Value Ref Range   Hgb A1c MFr Bld 7.5 (H) 4.8 - 5.6 %   Mean Plasma Glucose 169 mg/dL  Troponin I     Status: Abnormal   Collection Time: 04/27/16 11:30 AM  Result Value Ref Range   Troponin I 0.03 (HH) <0.03 ng/mL  Lactic acid, plasma     Status: None   Collection Time: 04/27/16 11:30 AM  Result Value Ref Range   Lactic Acid, Venous 1.2 0.5 - 1.9 mmol/L  Glucose, capillary     Status: Abnormal   Collection Time: 04/27/16 12:31 PM  Result Value Ref Range   Glucose-Capillary 272 (H) 65 - 99 mg/dL  Lactic acid, plasma     Status: None   Collection Time: 04/27/16  2:40 PM  Result Value Ref Range   Lactic Acid, Venous 1.5 0.5 - 1.9 mmol/L  Glucose,  capillary     Status: Abnormal   Collection Time: 04/27/16  4:30 PM  Result Value Ref Range   Glucose-Capillary 189 (H) 65 - 99 mg/dL  Glucose, capillary     Status: Abnormal   Collection Time: 04/27/16  8:26 PM  Result Value Ref Range   Glucose-Capillary 173 (H) 65 - 99 mg/dL  Glucose, capillary     Status: Abnormal   Collection Time: 04/28/16  1:24 AM  Result Value Ref Range   Glucose-Capillary 150 (H) 65 - 99 mg/dL  Comprehensive metabolic panel     Status: Abnormal   Collection Time: 04/28/16  2:11 AM  Result Value Ref Range   Sodium 141 135 - 145 mmol/L   Potassium 4.3 3.5 - 5.1 mmol/L   Chloride 105 101 - 111 mmol/L   CO2 25 22 - 32 mmol/L   Glucose, Bld 178 (H) 65 - 99 mg/dL   BUN 28 (H) 6 - 20 mg/dL   Creatinine, Ser 1.611.18 (H) 0.44 - 1.00 mg/dL   Calcium 8.2 (L) 8.9 - 10.3 mg/dL   Total Protein 5.5 (L) 6.5 - 8.1 g/dL   Albumin 2.5 (L) 3.5 - 5.0 g/dL   AST 096164 (H) 15 - 41  U/L   ALT 172 (H) 14 - 54 U/L   Alkaline Phosphatase 674 (H) 38 - 126 U/L   Total Bilirubin 4.1 (H) 0.3 - 1.2 mg/dL   GFR calc non Af Amer 44 (L) >60 mL/min   GFR calc Af Amer 51 (L) >60 mL/min   Anion gap 11 5 - 15  CBC     Status: Abnormal   Collection Time: 04/28/16  2:11 AM  Result Value Ref Range   WBC 12.4 (H) 4.0 - 10.5 K/uL   RBC 3.69 (L) 3.87 - 5.11 MIL/uL   Hemoglobin 10.1 (L) 12.0 - 15.0 g/dL   HCT 16.131.0 (L) 09.636.0 - 04.546.0 %   MCV 84.0 78.0 - 100.0 fL   MCH 27.4 26.0 - 34.0 pg   MCHC 32.6 30.0 - 36.0 g/dL   RDW 40.914.0 81.111.5 - 91.415.5 %   Platelets 266 150 - 400 K/uL  Protime-INR     Status: Abnormal   Collection Time: 04/28/16  2:11 AM  Result Value Ref Range   Prothrombin Time 17.5 (H) 11.4 - 15.2 seconds   INR 1.42   Glucose, capillary     Status: Abnormal   Collection Time: 04/28/16  4:58 AM  Result Value Ref Range   Glucose-Capillary 162 (H) 65 - 99 mg/dL  Glucose, capillary     Status: Abnormal   Collection Time: 04/28/16  7:56 AM  Result Value Ref Range   Glucose-Capillary 144 (H)  65 - 99 mg/dL    Studies/Results: X-ray Chest Pa And Lateral  Result Date: 04/28/2016 CLINICAL DATA:  Choledocholithiasis, preoperative assessment EXAM: CHEST  2 VIEW COMPARISON:  08/15/2015 FINDINGS: Normal heart size, mediastinal contours, and pulmonary vascularity. Peribronchial thickening with bibasilar atelectasis RIGHT greater than LEFT. Upper lungs clear. No pleural effusion or pneumothorax. Bones demineralized with chronic RIGHT rotator cuff tear. IMPRESSION: Mild bronchitic changes with bibasilar atelectasis RIGHT greater than LEFT. Electronically Signed   By: Ulyses SouthwardMark  Boles M.D.   On: 04/28/2016 07:58      Assessment: Gallstone pancreatitis. Choledocholithiasis. Choledocholithiasis.  Plan:   We will plan for ERCP with sphincterotomy and stone extraction from the CBD tomorrow at 1pm with Dr Madilyn FiremanHayes. After this she will need surgical consultation for cholecystectomy.    Gwenevere AbbotSAM F Aolanis Crispen 04/28/2016, 8:39 AM  Pager: [phone] If no answer or after 5 PM call [phone]

## 2016-04-28 NOTE — Evaluation (Signed)
Physical Therapy Evaluation Patient Details Name: Rachel ElksRalice Bilodeau MRN: [id number] DOB: [birth year] Today's Date: 04/28/2016   History of Present Illness  75 y.o. female with a Past Medical History of significant for HTN, HLD who presents with abd pain. GI onboard. ERCP likely in next 1-2 days for choledocholithiasis followed by GB removal  Clinical Impression  Patient demonstrates deficits in functional mobility as indicated below. Will need continued skilled PT to address deficits and maximize function. Will see as indicated and progress as tolerated. Will further assess for discharge needs following ERCP procedure.     Follow Up Recommendations  (TBD post OP)    Equipment Recommendations  None recommended by PT    Recommendations for Other Services       Precautions / Restrictions Precautions Precautions: Fall Precaution Comments: wears custom shoes due to toe amuptations      Mobility  Bed Mobility Overal bed mobility: Needs Assistance Bed Mobility: Supine to Sit     Supine to sit: Supervision     General bed mobility comments: supervision for line management and safety  Transfers Overall transfer level: Needs assistance Equipment used: None Transfers: Sit to/from Stand Sit to Stand: Min guard         General transfer comment: min guard for safety   Ambulation/Gait Ambulation/Gait assistance: Min assist Ambulation Distance (Feet): 50 Feet Assistive device: 1 person hand held assist Gait Pattern/deviations: Step-through pattern;Decreased stride length;Drifts right/left Gait velocity: decreased Gait velocity interpretation: Below normal speed for age/gender General Gait Details: some instability noted, min assist via HHA. patient with tendency to reach for furniture. Cued for safety (hx of toe amputations, no darko shoe present)  Stairs            Wheelchair Mobility    Modified Rankin (Stroke Patients Only)       Balance Overall balance  assessment: Needs assistance   Sitting balance-Leahy Scale: Good       Standing balance-Leahy Scale: Fair Standing balance comment: able to static stand but reports instability with long term standing (hx of toe amputations)                             Pertinent Vitals/Pain Pain Assessment: No/denies pain    Home Living Family/patient expects to be discharged to:: Private residence Living Arrangements: Alone Available Help at Discharge: Family Type of Home: House Home Access: Stairs to enter Entrance Stairs-Rails: None Entrance Stairs-Number of Steps: 1 Home Layout: One level Home Equipment: Environmental consultantWalker - 2 wheels;Shower seat;Cane - single point      Prior Function Level of Independence: Independent         Comments: ocassional use of SPC when standing for extended time     Hand Dominance   Dominant Hand: Right    Extremity/Trunk Assessment   Upper Extremity Assessment: Overall WFL for tasks assessed           Lower Extremity Assessment: Overall WFL for tasks assessed         Communication   Communication: No difficulties  Cognition Arousal/Alertness: Awake/alert Behavior During Therapy: WFL for tasks assessed/performed Overall Cognitive Status: Within Functional Limits for tasks assessed                      General Comments      Exercises     Assessment/Plan    PT Assessment Patient needs continued PT services  PT Problem List Decreased strength;Decreased activity tolerance;Decreased balance;Decreased  mobility;Pain          PT Treatment Interventions DME instruction;Gait training;Stair training;Functional mobility training;Therapeutic activities;Therapeutic exercise;Balance training;Patient/family education    PT Goals (Current goals can be found in the Care Plan section)  Acute Rehab PT Goals Patient Stated Goal: to get better PT Goal Formulation: With patient Time For Goal Achievement: 05/12/16 Potential to Achieve  Goals: Good    Frequency Min 3X/week   Barriers to discharge        Co-evaluation               End of Session Equipment Utilized During Treatment: Gait belt;Oxygen Activity Tolerance: Patient tolerated treatment well Patient left: in chair;with call bell/phone within reach;with chair alarm set Nurse Communication: Mobility status         Time: [id number] PT Time Calculation (min) (ACUTE ONLY): 22 min   Charges:   PT Evaluation $PT Eval Moderate Complexity: 1 Procedure     PT G Codes:        Fabio AsaDevon J Maralee Higuchi 04/28/2016, 10:32 AM  Charlotte Crumbevon Abella Shugart, PT DPT  [phone]

## 2016-04-29 ENCOUNTER — Encounter (HOSPITAL_COMMUNITY)
Admission: EM | Disposition: A | Discharge status: Skilled Nursing Facility | Payer: Self-pay | Source: Home / Self Care | Attending: Family Medicine

## 2016-04-29 ENCOUNTER — Inpatient Hospital Stay (HOSPITAL_COMMUNITY): Payer: Medicare Other | Admitting: Certified Registered Nurse Anesthetist

## 2016-04-29 ENCOUNTER — Inpatient Hospital Stay (HOSPITAL_COMMUNITY): Payer: Medicare Other

## 2016-04-29 ENCOUNTER — Encounter (HOSPITAL_COMMUNITY): Payer: Self-pay | Admitting: *Deleted

## 2016-04-29 DIAGNOSIS — I4891 Unspecified atrial fibrillation: Secondary | ICD-10-CM

## 2016-04-29 HISTORY — PX: ERCP: SHX5425

## 2016-04-29 LAB — ECHOCARDIOGRAM COMPLETE
CHL CUP DOP CALC LVOT VTI: 31.2 cm
CHL CUP LVOT MV VTI INDEX: 1.09 cm2/m2
CHL CUP LVOT MV VTI: 2.12
CHL CUP MV DEC (S): 229
CHL CUP MV M VEL: 102
CHL CUP RV SYS PRESS: 42 mmHg
CHL CUP TV REG PEAK VELOCITY: 314 cm/s
E/e' ratio: 14.89
EWDT: 229 ms
FS: 37 % (ref 28–44)
Height: 66 in
IV/PV OW: 1.08
LA diam end sys: 38 mm
LA diam index: 1.96 cm/m2
LA vol A4C: 62.5 ml
LASIZE: 38 mm
LAVOL: 82.5 mL
LAVOLIN: 42.5 mL/m2
LDCA: 3.46 cm2
LV PW d: 10.8 mm — AB (ref 0.6–1.1)
LV e' LATERAL: 9.74 cm/s
LVEEAVG: 14.89
LVEEMED: 14.89
LVOT peak grad rest: 8 mmHg
LVOTD: 21 mm
LVOTPV: 139 cm/s
LVOTSV: 108 mL
MV Peak grad: 8 mmHg
MV pk A vel: 127 m/s
MV pk E vel: 145 m/s
MVANNULUSVTI: 51 cm
Mean grad: 5 mmHg
RV LATERAL S' VELOCITY: 14.8 cm/s
TAPSE: 33.4 mm
TDI e' lateral: 9.74
TDI e' medial: 6.47
TR max vel: 314 cm/s
Weight: 2966.51 oz

## 2016-04-29 LAB — GLUCOSE, CAPILLARY
GLUCOSE-CAPILLARY: 104 mg/dL — AB (ref 65–99)
GLUCOSE-CAPILLARY: 105 mg/dL — AB (ref 65–99)
GLUCOSE-CAPILLARY: 120 mg/dL — AB (ref 65–99)
Glucose-Capillary: 91 mg/dL (ref 65–99)
Glucose-Capillary: 200 mg/dL — ABNORMAL HIGH (ref 65–99)

## 2016-04-29 SURGERY — ERCP, WITH INTERVENTION IF INDICATED
Anesthesia: General

## 2016-04-29 MED ORDER — FENTANYL CITRATE (PF) 100 MCG/2ML IJ SOLN
INTRAMUSCULAR | Status: DC | PRN
  Administered 2016-04-29: 50 ug via INTRAVENOUS

## 2016-04-29 MED ORDER — SODIUM CHLORIDE 0.9 % IV SOLN
INTRAVENOUS | Status: DC | PRN
  Administered 2016-04-29: 20 mL

## 2016-04-29 MED ORDER — PHENYLEPHRINE HCL 10 MG/ML IJ SOLN
INTRAVENOUS | Status: DC | PRN
  Administered 2016-04-29: 50 ug/min via INTRAVENOUS

## 2016-04-29 MED ORDER — GLUCAGON HCL RDNA (DIAGNOSTIC) 1 MG IJ SOLR
INTRAMUSCULAR | Status: AC
  Filled 2016-04-29: qty 1

## 2016-04-29 MED ORDER — LACTATED RINGERS IV SOLN
INTRAVENOUS | Status: DC
  Administered 2016-04-29: 12:00:00 via INTRAVENOUS

## 2016-04-29 MED ORDER — PROPOFOL 10 MG/ML IV BOLUS
INTRAVENOUS | Status: DC | PRN
  Administered 2016-04-29: 120 mg via INTRAVENOUS

## 2016-04-29 MED ORDER — IOPAMIDOL (ISOVUE-300) INJECTION 61%
INTRAVENOUS | Status: AC
  Filled 2016-04-29: qty 50

## 2016-04-29 MED ORDER — INDOMETHACIN 50 MG RE SUPP
RECTAL | Status: AC
  Filled 2016-04-29: qty 2

## 2016-04-29 MED ORDER — SUGAMMADEX SODIUM 200 MG/2ML IV SOLN
INTRAVENOUS | Status: DC | PRN
  Administered 2016-04-29: 200 mg via INTRAVENOUS

## 2016-04-29 MED ORDER — LIDOCAINE 2% (20 MG/ML) 5 ML SYRINGE
INTRAMUSCULAR | Status: DC | PRN
  Administered 2016-04-29: 80 mg via INTRAVENOUS

## 2016-04-29 MED ORDER — SODIUM CHLORIDE 0.9 % IV SOLN
INTRAVENOUS | Status: DC
  Administered 2016-04-29: 05:00:00 via INTRAVENOUS

## 2016-04-29 MED ORDER — ROCURONIUM BROMIDE 100 MG/10ML IV SOLN
INTRAVENOUS | Status: DC | PRN
  Administered 2016-04-29: 30 mg via INTRAVENOUS

## 2016-04-29 NOTE — Transfer of Care (Signed)
Immediate Anesthesia Transfer of Care Note  Patient: Rachel Wong  Procedure(s) Performed: Procedure(s): ENDOSCOPIC RETROGRADE CHOLANGIOPANCREATOGRAPHY (ERCP) (N/A)  Patient Location: Endoscopy Unit  Anesthesia Type:General  Level of Consciousness: awake, alert  and oriented  Airway & Oxygen Therapy: Patient Spontanous Breathing and Patient connected to nasal cannula oxygen  Post-op Assessment: Report given to RN, Post -op Vital signs reviewed and stable and Patient moving all extremities X 4  Post vital signs: Reviewed and stable  Last Vitals:  Vitals:   04/29/16 1344 04/29/16 1345  BP:  (!) 148/44  Pulse: 74 70  Resp: (!) 9 16  Temp:      Last Pain:  Vitals:   04/29/16 1218  TempSrc: Oral  PainSc:       Patients Stated Pain Goal: 0 (04/27/16 1614)  Complications: No apparent anesthesia complications

## 2016-04-29 NOTE — Consult Note (Signed)
[facility] Surgery Consult Note  Rachel Wong [id number]  [id number].    Requesting MD: Thereasa Solo Chief Complaint/Reason for Consult: gallstone pancreatitis  HPI:  Rachel Wong is a 7NA female who was admitted to [facility] on 04/27/16 with gallstone pancreatitis. Patient had been having mid abdominal pain, nausea, and vomiting for 2 days. Lipase was found to be 6436, WBC 11.6, total bilirubin 1.9, alkaline phosphatase 946, ALT 180, AST 183, and CT showed choledocholithiasis with severe resultant intrahepatic and extrahepatic biliary dilatation. She also developed new onset atrial fibrillation and was started on a Cardizem drip, cardiology consulted, and now she is off cardizem. Underwent ERCP today. General surgery consulted for cholecystectomy. States that she does still have some persistent abdominal pain. Pain is less than it was upon admission. It is associated with anorexia, but she denies any nausea or vomiting. Her pain is mid-abdominal and constant, worse with eating.  PMH significant for HTN, PVD, HLD, DM2, Diabetic ulcer of left great toe No prior history of abdominal surgery Nonsmoker Employment: retired from [employer]. Works as Network engineer in [place] reviewed and otherwise negative except for as above  Family History  Problem Relation Age of Onset  . Cancer Mother   . Alzheimer's disease Mother   . Lung cancer Father   . Cancer Maternal Grandmother   . Heart attack Maternal Grandfather   . Liver disease Brother     transplant  . Thyroid disease Child     thyroidectomy    Past Medical History:  Diagnosis Date  . Choledocholithiasis 04/2016  . Complication of anesthesia   . Diabetes ([place])   . Diabetic ulcer of left great toe ([place])   . History of nuclear stress test 2006   adenosine; normal perfusion, no fixed or reversible defects  . History of palpitations   . Hyperlipidemia   . Hypertension   . PONV (postoperative nausea and vomiting)   . PVD  (peripheral vascular disease) ([place])     Past Surgical History:  Procedure Laterality Date  . CARDIAC CATHETERIZATION  05/2000   r/t false-positive stress test; normal coronaries - Dr. Loni Muse. [place]   . KNEE ARTHROSCOPY Right 10/2001  . ROTATOR CUFF REPAIR  1999  . TOE SURGERY Right 04/2003  . TRANSTHORACIC ECHOCARDIOGRAM  05/2010   TF=>57%, stage 1 diastolic dysfunction; mild MR; mild AVR & AV mildly sclerotic    Social History:  reports that she quit smoking about 49 years ago. Her smoking use included Cigarettes. She has a 20.00 pack-year smoking history. She has never used smokeless tobacco. Her alcohol and drug histories are not on file.  Allergies: No Known Allergies  Medications Prior to Admission  Medication Sig Dispense Refill  . aspirin EC 81 MG tablet Take 81 mg by mouth daily.    . calcium citrate (CALCITRATE - DOSED IN MG ELEMENTAL CALCIUM) 950 MG tablet Take 1 tablet by mouth daily.    . enalapril (VASOTEC) 20 MG tablet Take 20 mg by mouth daily.    . magnesium oxide (MAG-OX) 400 MG tablet Take 400 mg by mouth daily.    . metFORMIN (GLUCOPHAGE-XR) 500 MG 24 hr tablet Take 2 tablets by mouth 2 (two) times daily.    . Multiple Vitamin (MULTIVITAMIN) capsule Take 1 capsule by mouth daily.    . Omega-3 Fatty Acids (FISH OIL) 1000 MG CAPS Take 1 capsule by mouth daily.     . pravastatin (PRAVACHOL) 40 MG tablet Take 1 tablet by mouth daily.    Marland Kitchen  VICTOZA 18 MG/3ML SOPN Inject 1.8 mg into the skin every morning.     . metoprolol succinate (TOPROL-XL) 100 MG 24 hr tablet Take 50 mg by mouth daily.       Blood pressure (!) 135/50, pulse 62, temperature 98.3 F (36.8 C), temperature source Oral, resp. rate (!) 21, height 5' 6" (1.676 m), weight 185 lb 6.5 oz (84.1 kg), SpO2 90 %. Physical Exam: General: pleasant, WD/WN white female who is laying in bed in NAD HEENT: head is normocephalic, atraumatic.  Sclera are noninjected.  PERRL.  Ears and nose without any masses or lesions.   Mouth is pink and moist Heart: regular, rate, and rhythm.  No obvious murmurs, gallops, or rubs noted.  Palpable pedal pulses bilaterally Lungs: CTAB, no wheezes, rhonchi, or rales noted.  Respiratory effort nonlabored Abd: soft, ND, +BS, no masses, hernias, or organomegaly. TTP epigastrium MS: all 4 extremities with no cyanosis, clubbing, or edema. Skin: warm and dry with no masses, lesions, or rashes Psych: A&Ox3 with an appropriate affect. Neuro: CM 2-12 intact, extremity CSM intact bilaterally, normal speech  Results for orders placed or performed during the hospital encounter of 04/27/16 (from the past 48 hour(s))  Glucose, capillary     Status: Abnormal   Collection Time: 04/27/16 12:31 PM  Result Value Ref Range   Glucose-Capillary 272 (H) 65 - 99 mg/dL  Lactic acid, plasma     Status: None   Collection Time: 04/27/16  2:40 PM  Result Value Ref Range   Lactic Acid, Venous 1.5 0.5 - 1.9 mmol/L  Glucose, capillary     Status: Abnormal   Collection Time: 04/27/16  4:30 PM  Result Value Ref Range   Glucose-Capillary 189 (H) 65 - 99 mg/dL  Glucose, capillary     Status: Abnormal   Collection Time: 04/27/16  8:26 PM  Result Value Ref Range   Glucose-Capillary 173 (H) 65 - 99 mg/dL  Glucose, capillary     Status: Abnormal   Collection Time: 04/28/16  1:24 AM  Result Value Ref Range   Glucose-Capillary 150 (H) 65 - 99 mg/dL  Comprehensive metabolic panel     Status: Abnormal   Collection Time: 04/28/16  2:11 AM  Result Value Ref Range   Sodium 141 135 - 145 mmol/L   Potassium 4.3 3.5 - 5.1 mmol/L   Chloride 105 101 - 111 mmol/L   CO2 25 22 - 32 mmol/L   Glucose, Bld 178 (H) 65 - 99 mg/dL   BUN 28 (H) 6 - 20 mg/dL   Creatinine, Ser 1.18 (H) 0.44 - 1.00 mg/dL   Calcium 8.2 (L) 8.9 - 10.3 mg/dL   Total Protein 5.5 (L) 6.5 - 8.1 g/dL   Albumin 2.5 (L) 3.5 - 5.0 g/dL   AST 164 (H) 15 - 41 U/L   ALT 172 (H) 14 - 54 U/L   Alkaline Phosphatase 674 (H) 38 - 126 U/L   Total  Bilirubin 4.1 (H) 0.3 - 1.2 mg/dL   GFR calc non Af Amer 44 (L) >60 mL/min   GFR calc Af Amer 51 (L) >60 mL/min    Comment: (NOTE) The eGFR has been calculated using the CKD EPI equation. This calculation has not been validated in all clinical situations. eGFR's persistently <60 mL/min signify possible Chronic Kidney Disease.    Anion gap 11 5 - 15  CBC     Status: Abnormal   Collection Time: 04/28/16  2:11 AM  Result Value Ref Range  WBC 12.4 (H) 4.0 - 10.5 K/uL   RBC 3.69 (L) 3.87 - 5.11 MIL/uL   Hemoglobin 10.1 (L) 12.0 - 15.0 g/dL   HCT 31.0 (L) 36.0 - 46.0 %   MCV 84.0 78.0 - 100.0 fL   MCH 27.4 26.0 - 34.0 pg   MCHC 32.6 30.0 - 36.0 g/dL   RDW 14.0 11.5 - 15.5 %   Platelets 266 150 - 400 K/uL  Protime-INR     Status: Abnormal   Collection Time: 04/28/16  2:11 AM  Result Value Ref Range   Prothrombin Time 17.5 (H) 11.4 - 15.2 seconds   INR 1.42   Glucose, capillary     Status: Abnormal   Collection Time: 04/28/16  4:58 AM  Result Value Ref Range   Glucose-Capillary 162 (H) 65 - 99 mg/dL  Glucose, capillary     Status: Abnormal   Collection Time: 04/28/16  7:56 AM  Result Value Ref Range   Glucose-Capillary 144 (H) 65 - 99 mg/dL  Glucose, capillary     Status: Abnormal   Collection Time: 04/28/16 12:02 PM  Result Value Ref Range   Glucose-Capillary 139 (H) 65 - 99 mg/dL  Glucose, capillary     Status: Abnormal   Collection Time: 04/28/16  4:25 PM  Result Value Ref Range   Glucose-Capillary 122 (H) 65 - 99 mg/dL  Glucose, capillary     Status: Abnormal   Collection Time: 04/28/16  8:02 PM  Result Value Ref Range   Glucose-Capillary 106 (H) 65 - 99 mg/dL  Glucose, capillary     Status: Abnormal   Collection Time: 04/29/16 12:12 AM  Result Value Ref Range   Glucose-Capillary 104 (H) 65 - 99 mg/dL  Glucose, capillary     Status: Abnormal   Collection Time: 04/29/16  4:15 AM  Result Value Ref Range   Glucose-Capillary 105 (H) 65 - 99 mg/dL  Glucose, capillary      Status: None   Collection Time: 04/29/16  7:50 AM  Result Value Ref Range   Glucose-Capillary 91 65 - 99 mg/dL   X-ray Chest Pa And Lateral  Result Date: 04/28/2016 CLINICAL DATA:  Choledocholithiasis, preoperative assessment EXAM: CHEST  2 VIEW COMPARISON:  08/15/2015 FINDINGS: Normal heart size, mediastinal contours, and pulmonary vascularity. Peribronchial thickening with bibasilar atelectasis RIGHT greater than LEFT. Upper lungs clear. No pleural effusion or pneumothorax. Bones demineralized with chronic RIGHT rotator cuff tear. IMPRESSION: Mild bronchitic changes with bibasilar atelectasis RIGHT greater than LEFT. Electronically Signed   By: Lavonia Dana M.D.   On: 04/28/2016 07:58      Assessment/Plan Gallstone pancreatitis Choledocholithiasis  - on admission lipase was found to be 6436, WBC 11.6, total bilirubin 1.9, alkaline phosphatase 946, ALT 180, AST 183 - CT showed choledocholithiasis with severe resultant intrahepatic and extrahepatic biliary dilatation - s/p ERCP today - most recent labs show WBC 12.4, total bilirubin 4.1, alkaline phosphatase 674, ALT 172, AST 164 - some persistent abdominal pain, improved since admission  New diagnosis atrial fibrillation - currently in NSR, cleared by cardiology as low cardiovascular risk for surgery - 04/29/16 ECHO 60%-65%  HTN PVD HLD DM2 Diabetic ulcer of left great toe   Plan - Patient is still somewhat symptomatic from pancreatitis and not quite ready for cholecystectomy. Recommend repeat labs (Lipase, CMP, CBC) in AM. We will reevaluate tomorrow, possible cholecystectomy Friday.   Jerrye Beavers, [facility] Surgery 04/29/2016, 12:01 PM Pager: [phone] Consults: [phone] Mon-Fri 7:00 am-4:30 pm Sat-Sun 7:00 am-11:30  am

## 2016-04-29 NOTE — Progress Notes (Signed)
Subjective:  No CP/ SOB. Denies abd pain. NPO now . For ERCP this PM by Dr Amedeo Plenty. NSR with PACs  Objective:  Temp:  [98.3 F (36.8 C)-99.5 F (37.5 C)] 98.3 F (36.8 C) (11/22 0738) Pulse Rate:  [62-77] 62 (11/22 0411) Resp:  [18-21] 21 (11/22 0411) BP: (117-139)/(46-57) 135/50 (11/22 0917) SpO2:  [90 %-96 %] 90 % (11/22 0411) Weight:  [185 lb 6.5 oz (84.1 kg)] 185 lb 6.5 oz (84.1 kg) (11/22 0738) Weight change: 6 lb 13.4 oz (3.1 kg)  Intake/Output from previous day: 11/21 0701 - 11/22 0700 In: 2159.1 [P.O.:120; I.V.:1839.1; IV Piggyback:200] Out: 700 [Urine:700]  Intake/Output from this shift: Total I/O In: 207.2 [I.V.:207.2] Out: -   Physical Exam: General appearance: alert and no distress Neck: no adenopathy, no carotid bruit, no JVD, supple, symmetrical, trachea midline and thyroid not enlarged, symmetric, no tenderness/mass/nodules Lungs: clear to auscultation bilaterally Heart: regular rate and rhythm, S1, S2 normal, no murmur, click, rub or gallop Extremities: extremities normal, atraumatic, no cyanosis or edema  Lab Results: Results for orders placed or performed during the hospital encounter of 04/27/16 (from the past 48 hour(s))  Urine culture     Status: Abnormal   Collection Time: 04/27/16 10:17 AM  Result Value Ref Range   Specimen Description URINE, CLEAN CATCH    Special Requests NONE    Culture MULTIPLE SPECIES PRESENT, SUGGEST RECOLLECTION (A)    Report Status 04/28/2016 FINAL   Culture, blood (Routine X 2) w Reflex to ID Panel     Status: None (Preliminary result)   Collection Time: 04/27/16 10:48 AM  Result Value Ref Range   Specimen Description BLOOD LEFT ANTECUBITAL    Special Requests BOTTLES DRAWN AEROBIC ONLY 5CC    Culture NO GROWTH < 24 HOURS    Report Status PENDING   Hemoglobin A1c     Status: Abnormal   Collection Time: 04/27/16 10:58 AM  Result Value Ref Range   Hgb A1c MFr Bld 7.5 (H) 4.8 - 5.6 %    Comment: (NOTE)  Pre-diabetes: 5.7 - 6.4         Diabetes: >6.4         Glycemic control for adults with diabetes: <7.0    Mean Plasma Glucose 169 mg/dL    Comment: (NOTE) Performed At: [facility] [address] [place], [place] [id number] Lindon Romp MD GY:[id number]   Culture, blood (Routine X 2) w Reflex to ID Panel     Status: None (Preliminary result)   Collection Time: 04/27/16 10:58 AM  Result Value Ref Range   Specimen Description BLOOD BLOOD LEFT ARM    Special Requests BOTTLES DRAWN AEROBIC ONLY 5CC    Culture NO GROWTH < 24 HOURS    Report Status PENDING   Troponin I     Status: Abnormal   Collection Time: 04/27/16 11:30 AM  Result Value Ref Range   Troponin I 0.03 (HH) <0.03 ng/mL    Comment: HEMOLYSIS AT THIS LEVEL MAY AFFECT RESULT CRITICAL RESULT CALLED TO, READ BACK BY AND VERIFIED WITH: J.WALKER,RN 04/27/16 1231 BY BSLADE   Lactic acid, plasma     Status: None   Collection Time: 04/27/16 11:30 AM  Result Value Ref Range   Lactic Acid, Venous 1.2 0.5 - 1.9 mmol/L  Glucose, capillary     Status: Abnormal   Collection Time: 04/27/16 12:31 PM  Result Value Ref Range   Glucose-Capillary 272 (H) 65 - 99 mg/dL  Lactic acid,  plasma     Status: None   Collection Time: 04/27/16  2:40 PM  Result Value Ref Range   Lactic Acid, Venous 1.5 0.5 - 1.9 mmol/L  Glucose, capillary     Status: Abnormal   Collection Time: 04/27/16  4:30 PM  Result Value Ref Range   Glucose-Capillary 189 (H) 65 - 99 mg/dL  Glucose, capillary     Status: Abnormal   Collection Time: 04/27/16  8:26 PM  Result Value Ref Range   Glucose-Capillary 173 (H) 65 - 99 mg/dL  Glucose, capillary     Status: Abnormal   Collection Time: 04/28/16  1:24 AM  Result Value Ref Range   Glucose-Capillary 150 (H) 65 - 99 mg/dL  Comprehensive metabolic panel     Status: Abnormal   Collection Time: 04/28/16  2:11 AM  Result Value Ref Range   Sodium 141 135 - 145 mmol/L   Potassium 4.3 3.5 - 5.1 mmol/L    Chloride 105 101 - 111 mmol/L   CO2 25 22 - 32 mmol/L   Glucose, Bld 178 (H) 65 - 99 mg/dL   BUN 28 (H) 6 - 20 mg/dL   Creatinine, Ser 1.18 (H) 0.44 - 1.00 mg/dL   Calcium 8.2 (L) 8.9 - 10.3 mg/dL   Total Protein 5.5 (L) 6.5 - 8.1 g/dL   Albumin 2.5 (L) 3.5 - 5.0 g/dL   AST 164 (H) 15 - 41 U/L   ALT 172 (H) 14 - 54 U/L   Alkaline Phosphatase 674 (H) 38 - 126 U/L   Total Bilirubin 4.1 (H) 0.3 - 1.2 mg/dL   GFR calc non Af Amer 44 (L) >60 mL/min   GFR calc Af Amer 51 (L) >60 mL/min    Comment: (NOTE) The eGFR has been calculated using the CKD EPI equation. This calculation has not been validated in all clinical situations. eGFR's persistently <60 mL/min signify possible Chronic Kidney Disease.    Anion gap 11 5 - 15  CBC     Status: Abnormal   Collection Time: 04/28/16  2:11 AM  Result Value Ref Range   WBC 12.4 (H) 4.0 - 10.5 K/uL   RBC 3.69 (L) 3.87 - 5.11 MIL/uL   Hemoglobin 10.1 (L) 12.0 - 15.0 g/dL   HCT 31.0 (L) 36.0 - 46.0 %   MCV 84.0 78.0 - 100.0 fL   MCH 27.4 26.0 - 34.0 pg   MCHC 32.6 30.0 - 36.0 g/dL   RDW 14.0 11.5 - 15.5 %   Platelets 266 150 - 400 K/uL  Protime-INR     Status: Abnormal   Collection Time: 04/28/16  2:11 AM  Result Value Ref Range   Prothrombin Time 17.5 (H) 11.4 - 15.2 seconds   INR 1.42   Glucose, capillary     Status: Abnormal   Collection Time: 04/28/16  4:58 AM  Result Value Ref Range   Glucose-Capillary 162 (H) 65 - 99 mg/dL  Glucose, capillary     Status: Abnormal   Collection Time: 04/28/16  7:56 AM  Result Value Ref Range   Glucose-Capillary 144 (H) 65 - 99 mg/dL  Glucose, capillary     Status: Abnormal   Collection Time: 04/28/16 12:02 PM  Result Value Ref Range   Glucose-Capillary 139 (H) 65 - 99 mg/dL  Glucose, capillary     Status: Abnormal   Collection Time: 04/28/16  4:25 PM  Result Value Ref Range   Glucose-Capillary 122 (H) 65 - 99 mg/dL  Glucose, capillary  Status: Abnormal   Collection Time: 04/28/16  8:02 PM    Result Value Ref Range   Glucose-Capillary 106 (H) 65 - 99 mg/dL  Glucose, capillary     Status: Abnormal   Collection Time: 04/29/16 12:12 AM  Result Value Ref Range   Glucose-Capillary 104 (H) 65 - 99 mg/dL  Glucose, capillary     Status: Abnormal   Collection Time: 04/29/16  4:15 AM  Result Value Ref Range   Glucose-Capillary 105 (H) 65 - 99 mg/dL  Glucose, capillary     Status: None   Collection Time: 04/29/16  7:50 AM  Result Value Ref Range   Glucose-Capillary 91 65 - 99 mg/dL    Imaging: Imaging results have been reviewed  Tele- NSR with PACs  Assessment/Plan:   1. Active Problems: 2.   Palpitations 3.   HTN (hypertension) 4.   DM2 (diabetes mellitus, type 2) ([place]) 5.   Hyperlipidemia 6.   Choledocholithiasis with obstruction 7.   Atrial fibrillation ([place]) 8.   Choledocholithiasis 9.   Atrial fibrillation with rapid ventricular response ([facility]) 10.   Pancreatitis due to common bile duct stone 11.   Uncontrolled type 2 diabetes mellitus with complication ([place]) 12.   Time Spent Directly with Patient:  15 minutes  Length of Stay:  LOS: 2 days   Cardiac stable. For ERCP this PM. I have reviewed rhythm strips and see no evid of PAF. In addition, there was no NSVT (all artifact). Was briefly on IV dilt, now off. Exam benign. LAbs OK. Will be available if needed.   Quay Burow 04/29/2016, 10:15 AM

## 2016-04-29 NOTE — Anesthesia Procedure Notes (Signed)
Procedure Name: Intubation Date/Time: 04/29/2016 1:07 PM Performed by: Sharlene DoryWALKER, Orin Eberwein E Pre-anesthesia Checklist: Patient identified, Emergency Drugs available, Suction available and Patient being monitored Patient Re-evaluated:Patient Re-evaluated prior to inductionOxygen Delivery Method: Circle system utilized Preoxygenation: Pre-oxygenation with 100% oxygen Intubation Type: IV induction Ventilation: Mask ventilation without difficulty Laryngoscope Size: Mac and 3 Grade View: Grade I Tube type: Oral Tube size: 7.0 mm Number of attempts: 1 Placement Confirmation: ETT inserted through vocal cords under direct vision,  positive ETCO2 and breath sounds checked- equal and bilateral Secured at: 21 cm Tube secured with: Tape Dental Injury: Teeth and Oropharynx as per pre-operative assessment

## 2016-04-29 NOTE — Progress Notes (Signed)
ERCP done, apparently passed a stone already. Sphincterotomy performed and several balloon sweeps made with no stone or stone fragments seen to pass. Will observe overnight for complications. Recommend surgical consult for elective cholecystectomy.

## 2016-04-29 NOTE — Anesthesia Preprocedure Evaluation (Addendum)
Anesthesia Evaluation  Patient identified by MRN, date of birth, ID band Patient awake    Reviewed: Allergy & Precautions, H&P , NPO status , Patient's Chart, lab work & pertinent test results  History of Anesthesia Complications (+) PONV  Airway Mallampati: II   Neck ROM: full    Dental  (+) Teeth Intact,    Pulmonary former smoker,    breath sounds clear to auscultation       Cardiovascular hypertension, + Peripheral Vascular Disease   Rhythm:regular Rate:Normal     Neuro/Psych    GI/Hepatic   Endo/Other  diabetes, Type 2  Renal/GU      Musculoskeletal   Abdominal   Peds  Hematology   Anesthesia Other Findings   Reproductive/Obstetrics                            Anesthesia Physical Anesthesia Plan  ASA: II  Anesthesia Plan: General   Post-op Pain Management:    Induction: Intravenous  Airway Management Planned: Oral ETT  Additional Equipment:   Intra-op Plan:   Post-operative Plan: Extubation in OR  Informed Consent: I have reviewed the patients History and Physical, chart, labs and discussed the procedure including the risks, benefits and alternatives for the proposed anesthesia with the patient or authorized representative who has indicated his/her understanding and acceptance.     Plan Discussed with: CRNA, Anesthesiologist and Surgeon  Anesthesia Plan Comments:         Anesthesia Quick Evaluation

## 2016-04-29 NOTE — Progress Notes (Signed)
  Echocardiogram 2D Echocardiogram has been performed.  Rachel SavoyCasey N Suleyma Wong 04/29/2016, 11:08 AM

## 2016-04-29 NOTE — Progress Notes (Signed)
[place] TEAM 1 - Stepdown/ICU TEAM  Merceda Elksalice Goodchild  OZD:[id number] DOB: 12/24/1940 DOA: 04/27/2016 PCP: Raynelle JanSPRY,HEATHER M., MD    Brief Narrative:  [age] yoF Hx HTN, PVD, HLD, DM2, and a Diabetic ulcer of left great toe who [facility] with acute mid abdominal pain, nausea, and non-bloody vomiting.  She was in her usual state of health until 2 days prior, developing mild nausea and vomiting, quickly resolving, without any recurrence until early a.m, accompanied by severe pain. Denied diarrhea.  Subjective: The pt is feeling much better.  She denies cp, sob, n/v, or abdom pain at this time.  She is alert and oriented and conversant.    Assessment & Plan:  Choledocholithiasis with obstruction as evidenced by CT abdomen and pelvis GI following - for ERCP today - will need eventual cholecystectomy - will consult Gen Surg for timing of same   Acute pancreatitis  Due to above - clinically stabilizing - advance diet slowly following ERCP  Rule out Atrial Fibrillation Currently in NSR - no clear evidence of A fib per Cards review of tele records - follow on tele - no indication for anticoag at this time   Acute renal injury Due to above - hydrate and follow trend   Recent Labs Lab 04/27/16 0307 04/28/16 0211  CREATININE 0.91 1.18*    HTN  BP currently reasonably controllled  Hyperlipidemia Cont Pravastatin   DM2 11/20 A1c 7.5 - CBG well controlled at this time   DVT prophylaxis: lovenox  Code Status: FULL CODE Family Communication: no family present at time of exam  Disposition Plan: awaiting ERCP  Cons[facility]ants[facility]  [facility]  [facility]  [facility]   Procedures: 11/22 TTE -   Antimicrobials:  Zosyn 11/19 >  Objective: Blood pressure (!) 135/50, pulse 62, temperature 98.3 F (36.8 C), temperature source Oral, resp. rate (!) 21, height 5\' 6"  (1.676 m), weight 84.1 kg (185 lb 6.5 oz), SpO2 90 %.  Intake/Output Summary (Last 24 hours) at 04/29/16 1157 Last  data filed at 04/29/16 0918  Gross per 24 hour  Intake          1381.01 ml  Output              600 ml  Net           781.01 ml   Filed Weights   04/27/16 0921 04/28/16 0813 04/29/16 0738  Weight: 80.2 kg (176 lb 12.9 oz) 83.3 kg (183 lb 10.3 oz) 84.1 kg (185 lb 6.5 oz)    Examination: General: No acute respiratory distress Lungs: Clear to auscultation bilaterally without wheezes or crackles Cardiovascular: Regular rate and rhythm without murmur gallop or rub normal S1 and S2 Abdomen: Nontender, nondistended, soft, bowel sounds positive, no rebound, no ascites, no appreciable mass Extremities: No significant cyanosis, clubbing, or edema bilateral lower extremities  CBC:  Recent Labs Lab 04/27/16 0307 04/28/16 0211  WBC 11.6* 12.4*  NEUTROABS 9.8*  --   HGB 11.8* 10.1*  HCT 36.1 31.0*  MCV 84.9 84.0  PLT 338 266   Basic Metabolic Panel:  Recent Labs Lab 04/27/16 0307 04/28/16 0211  NA 136 141  K 3.8 4.3  CL 98* 105  CO2 25 25  GLUCOSE 303* 178*  BUN 40* 28*  CREATININE 0.91 1.18*  CALCIUM 9.4 8.2*   GFR: Estimated Creatinine Clearance: 44.3 mL/min (by C-G formula based on SCr of 1.18 mg/dL (H)).  Liver Function Tests:  Recent Labs Lab 04/27/16 0307 04/28/16 0211  AST 183* 164*  ALT 180* 172*  ALKPHOS 946* 674*  BILITOT 1.9* 4.1*  PROT 7.3 5.5*  ALBUMIN 3.7 2.5*    Recent Labs Lab 04/27/16 0307  LIPASE 6,436*    Coagulation Profile:  Recent Labs Lab 04/28/16 0211  INR 1.42    Cardiac Enzymes:  Recent Labs Lab 04/27/16 0710 04/27/16 1130  TROPONINI 0.03* 0.03*    HbA1C: Hgb A1c MFr Bld  Date/Time Value Ref Range Status  04/27/2016 10:58 AM 7.5 (H) 4.8 - 5.6 % Final    Comment:    (NOTE)         Pre-diabetes: 5.7 - 6.4         Diabetes: >6.4         Glycemic control for adults with diabetes: <7.0     CBG:  Recent Labs Lab 04/28/16 1625 04/28/16 2002 04/29/16 0012 04/29/16 0415 04/29/16 0750  GLUCAP 122* 106* 104*  105* 91    Recent Results (from the past 240 hour(s))  MRSA PCR Screening     Status: None   Collection Time: 04/27/16  9:52 AM  Result Value Ref Range Status   MRSA by PCR NEGATIVE NEGATIVE Final    Comment:        The GeneXpert MRSA Assay (FDA approved for NASAL specimens only), is one component of a comprehensive MRSA colonization surveillance program. It is not intended to diagnose MRSA infection nor to guide or monitor treatment for MRSA infections.   Urine culture     Status: Abnormal   Collection Time: 04/27/16 10:17 AM  Result Value Ref Range Status   Specimen Description URINE, CLEAN CATCH  Final   Special Requests NONE  Final   Culture MULTIPLE SPECIES PRESENT, SUGGEST RECOLLECTION (A)  Final   Report Status 04/28/2016 FINAL  Final  Culture, blood (Routine X 2) w Reflex to ID Panel     Status: None (Preliminary result)   Collection Time: 04/27/16 10:48 AM  Result Value Ref Range Status   Specimen Description BLOOD LEFT ANTECUBITAL  Final   Special Requests BOTTLES DRAWN AEROBIC ONLY 5CC  Final   Culture NO GROWTH < 24 HOURS  Final   Report Status PENDING  Incomplete  Culture, blood (Routine X 2) w Reflex to ID Panel     Status: None (Preliminary result)   Collection Time: 04/27/16 10:58 AM  Result Value Ref Range Status   Specimen Description BLOOD BLOOD LEFT ARM  Final   Special Requests BOTTLES DRAWN AEROBIC ONLY 5CC  Final   Culture NO GROWTH < 24 HOURS  Final   Report Status PENDING  Incomplete     Scheduled Meds: . enalapril  20 mg Oral Daily  . enoxaparin (LOVENOX) injection  40 mg Subcutaneous Q24H  . insulin aspart  0-9 Units Subcutaneous TID WC  . metoprolol succinate  50 mg Oral Daily  . piperacillin-tazobactam (ZOSYN)  IV  3.375 g Intravenous Q8H  . pravastatin  40 mg Oral Daily  . sodium chloride flush  3 mL Intravenous Q12H   Continuous Infusions: . sodium chloride 1 mL (04/29/16 1050)  . sodium chloride 20 mL/hr at 04/29/16 0513     LOS:  2 days   Lonia BloodJeffrey T. Dion Sibal, MD [facility] Office  [phone] Pager - Text Page per Amion as per below:  On-Call/Text Page:      [email or web address]      password [email or web address]  If 7PM-7AM, please contact night-coverage [email or web address] Password [email or web address] 04/29/2016, 11:57 AM

## 2016-04-29 NOTE — Anesthesia Postprocedure Evaluation (Signed)
Anesthesia Post Note  Patient: Rachel Wong  Procedure(s) Performed: Procedure(s) (LRB): ENDOSCOPIC RETROGRADE CHOLANGIOPANCREATOGRAPHY (ERCP) (N/A)  Patient location during evaluation: PACU Anesthesia Type: General Level of consciousness: awake and alert and patient cooperative Pain management: pain level controlled Vital Signs Assessment: post-procedure vital signs reviewed and stable Respiratory status: spontaneous breathing and respiratory function stable Cardiovascular status: stable Anesthetic complications: no    Last Vitals:  Vitals:   04/29/16 1405 04/29/16 1415  BP: (!) 146/67 (!) 146/40  Pulse: 65 (!) 58  Resp: 10 15  Temp:      Last Pain:  Vitals:   04/29/16 1218  TempSrc: Oral  PainSc:                  Cheyne Boulden S

## 2016-04-29 NOTE — Op Note (Signed)
[facility] Patient Name: Rachel Wong Procedure Date : 04/29/2016 MRN: [id number] Attending MD: Barrie Folk , MD Date of Birth: February 13, 1941 CSN: [id number] Age: 75 Admit Type: Inpatient Procedure:                ERCP Indications:              Suspected bile duct stone(s) Providers:                Everardo All. Madilyn Fireman, MD, Priscella Mann, RN, Kandice Robinsons, Technician Referring MD:              Medicines:                General Anesthesia Complications:            No immediate complications. Estimated Blood Loss:     Estimated blood loss: none. Procedure:                Pre-Anesthesia Assessment:                           - Prior to the procedure, a History and Physical                            was performed, and patient medications and                            allergies were reviewed. The patient's tolerance of                            previous anesthesia was also reviewed. The risks                            and benefits of the procedure and the sedation                            options and risks were discussed with the patient.                            All questions were answered, and informed consent                            was obtained. Prior Anticoagulants: The patient has                            taken no previous anticoagulant or antiplatelet                            agents. ASA Grade Assessment: II - A patient with                            mild systemic disease. After reviewing the risks  and benefits, the patient was deemed in                            satisfactory condition to undergo the procedure.                           After obtaining informed consent, the scope was                            passed under direct vision. Throughout the                            procedure, the patient's blood pressure, pulse, and                            oxygen saturations were monitored  continuously. The                            was introduced through the mouth, and used to                            inject contrast into and used to inject contrast                            into the bile duct. The ERCP was accomplished                            without difficulty. The patient tolerated the                            procedure well. Scope In: Scope Out: Findings:      There was a gaping orifice at the major papilla. The bile duct was       deeply cannulated. Contrast was injected. I personally interpreted the       bile duct images. Ductal flow of contrast was adequate. Image quality       was adequate. Contrast extended to the entire biliary tree. The main       bile duct was moderately dilated and diffusely dilated, uncertain       etiology. The largest diameter was 10 mm. Choledocholithiasis was found       in a nondilated duct. The main bile duct contained no stones. A 6 mm       biliary sphincterotomy was made with a traction (standard)       sphincterotome using ERBE electrocautery. There was no       post-sphincterotomy bleeding. To discover objects, the biliary tree was       swept with a 15 mm balloon starting at the bifurcation. Nothing was       found. Impression:               - The major papilla appeared to have a gaping                            orifice.                           -  The entire main bile duct was moderately dilated,                            uncertain etiology.                           - Choledocholithiasis was found. [Removal].                           - A biliary sphincterotomy was performed.                           - The biliary tree was swept and nothing was found. Recommendation:           - Observe patient's clinical course. Procedure Code(s):        --- Professional ---                           [id number], Endoscopic retrograde                            cholangiopancreatography (ERCP); with                             sphincterotomy/papillotomy                           [phone], Endoscopic catheterization of the biliary                            ductal system, radiological supervision and                            interpretation Diagnosis Code(s):        --- Professional ---                           K83.9, Disease of biliary tract, unspecified                           K80.50, Calculus of bile duct without cholangitis                            or cholecystitis without obstruction                           K83.8, Other specified diseases of biliary tract CPT copyright 2016 American Medical Association. All rights reserved. The codes documented in this report are preliminary and upon coder review may  be revised to meet current compliance requirements. Barrie FolkJohn C Tollie Canada, MD 04/29/2016 1:45:24 PM This report has been signed electronically. Number of Addenda: 0

## 2016-04-30 DIAGNOSIS — R002 Palpitations: Secondary | ICD-10-CM

## 2016-04-30 DIAGNOSIS — I4891 Unspecified atrial fibrillation: Secondary | ICD-10-CM

## 2016-04-30 DIAGNOSIS — K8035 Calculus of bile duct with chronic cholangitis with obstruction: Secondary | ICD-10-CM

## 2016-04-30 LAB — COMPREHENSIVE METABOLIC PANEL
ALBUMIN: 2.2 g/dL — AB (ref 3.5–5.0)
ALK PHOS: 480 U/L — AB (ref 38–126)
ALK PHOS: 493 U/L — AB (ref 38–126)
ALT: 86 U/L — AB (ref 14–54)
ALT: 86 U/L — ABNORMAL HIGH (ref 14–54)
ANION GAP: 8 (ref 5–15)
AST: 42 U/L — ABNORMAL HIGH (ref 15–41)
AST: 42 U/L — AB (ref 15–41)
Albumin: 2 g/dL — ABNORMAL LOW (ref 3.5–5.0)
Anion gap: 7 (ref 5–15)
BILIRUBIN TOTAL: 1.4 mg/dL — AB (ref 0.3–1.2)
BUN: 19 mg/dL (ref 6–20)
BUN: 17 mg/dL (ref 6–20)
CALCIUM: 7.7 mg/dL — AB (ref 8.9–10.3)
CALCIUM: 8 mg/dL — AB (ref 8.9–10.3)
CO2: 22 mmol/L (ref 22–32)
CO2: 22 mmol/L (ref 22–32)
CREATININE: 0.89 mg/dL (ref 0.44–1.00)
Chloride: 108 mmol/L (ref 101–111)
Chloride: 106 mmol/L (ref 101–111)
Creatinine, Ser: 0.9 mg/dL (ref 0.44–1.00)
GFR calc Af Amer: >60 mL/min (ref >60)
GFR calc non Af Amer: >60 mL/min (ref >60)
GLUCOSE: 198 mg/dL — AB (ref 65–99)
Glucose, Bld: 169 mg/dL — ABNORMAL HIGH (ref 65–99)
POTASSIUM: 3.8 mmol/L (ref 3.5–5.1)
Potassium: 3.6 mmol/L (ref 3.5–5.1)
SODIUM: 136 mmol/L (ref 135–145)
Sodium: 137 mmol/L (ref 135–145)
TOTAL PROTEIN: 5.8 g/dL — AB (ref 6.5–8.1)
Total Bilirubin: 1.4 mg/dL — ABNORMAL HIGH (ref 0.3–1.2)
Total Protein: 5.2 g/dL — ABNORMAL LOW (ref 6.5–8.1)

## 2016-04-30 LAB — CBC
HCT: 29.1 % — ABNORMAL LOW (ref 36.0–46.0)
Hemoglobin: 9.5 g/dL — ABNORMAL LOW (ref 12.0–15.0)
MCH: 27.2 pg (ref 26.0–34.0)
MCHC: 32.6 g/dL (ref 30.0–36.0)
MCV: 83.4 fL (ref 78.0–100.0)
PLATELETS: 225 10*3/uL (ref 150–400)
RBC: 3.49 MIL/uL — AB (ref 3.87–5.11)
RDW: 13.6 % (ref 11.5–15.5)
WBC: 7.1 10*3/uL (ref 4.0–10.5)

## 2016-04-30 LAB — GLUCOSE, CAPILLARY
GLUCOSE-CAPILLARY: 189 mg/dL — AB (ref 65–99)
GLUCOSE-CAPILLARY: 238 mg/dL — AB (ref 65–99)
Glucose-Capillary: 213 mg/dL — ABNORMAL HIGH (ref 65–99)
Glucose-Capillary: 249 mg/dL — ABNORMAL HIGH (ref 65–99)

## 2016-04-30 LAB — PROTIME-INR
INR: 1.28
PROTHROMBIN TIME: 16.1 s — AB (ref 11.4–15.2)

## 2016-04-30 LAB — LIPASE, BLOOD: LIPASE: 210 U/L — AB (ref 11–51)

## 2016-04-30 MED ORDER — METOPROLOL SUCCINATE ER 100 MG PO TB24
100.0000 mg | ORAL_TABLET | Freq: Every day | ORAL | Status: DC
  Administered 2016-04-30 – 2016-05-04 (×5): 100 mg via ORAL
  Filled 2016-04-30 (×5): qty 1

## 2016-04-30 MED ORDER — OXYCODONE HCL 5 MG PO TABS: 5.0000 mg | ORAL_TABLET | Freq: Once | ORAL | Status: DC | PRN

## 2016-04-30 MED ORDER — ONDANSETRON HCL 4 MG/2ML IJ SOLN: 4.0000 mg | Freq: Four times a day (QID) | INTRAMUSCULAR | Status: DC | PRN

## 2016-04-30 MED ORDER — OXYCODONE HCL 5 MG/5ML PO SOLN: 5.0000 mg | Freq: Once | ORAL | Status: DC | PRN

## 2016-04-30 MED ORDER — FENTANYL CITRATE (PF) 100 MCG/2ML IJ SOLN: 25.0000 ug | INTRAMUSCULAR | Status: DC | PRN

## 2016-04-30 NOTE — Progress Notes (Signed)
1 Day Post-Op  Subjective: Sipping on clear liquids No abdominal pain, nausea, or vomiting Labs improving  Objective: Vital signs in last 24 hours: Temp:  [98.7 F (37.1 C)-99.1 F (37.3 C)] 98.8 F (37.1 C) (11/23 0359) Pulse Rate:  [51-74] 55 (11/23 0400) Resp:  [8-22] 19 (11/23 0400) BP: (135-162)/(40-67) 149/54 (11/23 0400) SpO2:  [90 %-97 %] 96 % (11/23 0400) Last BM Date: 04/28/16  Intake/Output from previous day: 11/22 0701 - 11/23 0700 In: 3303.8 [P.O.:360; I.V.:2843.8; IV Piggyback:100] Out: 1000 [Urine:1000] Intake/Output this shift: No intake/output data recorded.  General appearance: alert, cooperative and no distress Resp: clear to auscultation bilaterally Cardio: regular rate and rhythm, S1, S2 normal, no murmur, click, rub or gallop GI: soft, mild epigastric tenderness; no rebound; no guarding; + BS  Lab Results:   Recent Labs  04/28/16 0211 04/30/16 0320  WBC 12.4* 7.1  HGB 10.1* 9.5*  HCT 31.0* 29.1*  PLT 266 225   BMET  Recent Labs  04/28/16 0211 04/30/16 0320  NA 141 137  K 4.3 3.6  CL 105 108  CO2 25 22  GLUCOSE 178* 169*  BUN 28* 19  CREATININE 1.18* 0.89  CALCIUM 8.2* 7.7*   PT/INR  Recent Labs  04/28/16 0211 04/30/16 0320  LABPROT 17.5* 16.1*  INR 1.42 1.28   ABG No results for input(s): PHART, HCO3 in the last 72 hours.  Invalid input(s): PCO2, PO2  Studies/Results: Dg Ercp  Result Date: 04/29/2016 CLINICAL DATA:  Palpitations EXAM: ERCP TECHNIQUE: Multiple spot images obtained with the fluoroscopic device and submitted for interpretation post-procedure. FLUOROSCOPY TIME:  Fluoroscopy Time:  1 minutes and 53 seconds Radiation Exposure Index (if provided by the fluoroscopic device): Number of Acquired Spot Images: 2 COMPARISON:  None. FINDINGS: Contrast fills the biliary tree and duodenum there are no filling defects in the common bile duct. The common bile duct is mildly dilated. IMPRESSION: See above. These images were  submitted for radiologic interpretation only. Please see the procedural report for the amount of contrast and the fluoroscopy time utilized. Electronically Signed   By: Jolaine ClickArthur  Hoss M.D.   On: 04/29/2016 13:52    Anti-infectives: Anti-infectives    Start     Dose/Rate Route Frequency Ordered Stop   04/27/16 1400  piperacillin-tazobactam (ZOSYN) IVPB 3.375 g     3.375 g 12.5 mL/hr over 240 Minutes Intravenous Every 8 hours 04/27/16 1017     04/27/16 0615  piperacillin-tazobactam (ZOSYN) IVPB 3.375 g     3.375 g 100 mL/hr over 30 Minutes Intravenous  Once 04/27/16 0609 04/27/16 0700      Assessment/Plan: Gallstone pancreatitis Choledocholithiasis  - on admission lipase was found to be 6436, WBC 11.6, total bilirubin 1.9, alkaline phosphatase 946, ALT 180, AST 183 - CT showed choledocholithiasis with severe resultant intrahepatic and extrahepatic biliary dilatation - s/p ERCP today - most recent labs show WBC 7.1, total bilirubin 1.4, alkaline phosphatase 480, AST 42/ ALT 86 - trending down - Lipase down to 210 - some persistent abdominal pain, improved since admission  New diagnosis atrial fibrillation - currently in NSR, cleared by cardiology as low cardiovascular risk for surgery - 04/29/16 ECHO 60%-65%  HTN PVD HLD DM2 Diabetic ulcer of left great toe   Plan - Patient is still somewhat symptomatic from pancreatitis and not quite ready for cholecystectomy. Recommend repeat labs (Lipase, CMP, CBC) in AM. We will reevaluate tomorrow, possible cholecystectomy Friday. NPO p MN  LOS: 3 days    Rachel Fortunato K. 04/30/2016

## 2016-04-30 NOTE — Progress Notes (Signed)
Report given to AlgeriaZenaida, RN from [employer]6N.

## 2016-04-30 NOTE — Progress Notes (Addendum)
DAILY PROGRESS NOTE  Subjective:  Feels much better today. Looks like she passed a gallstone based on ERCP findings. Will need cholecystectomy. EKG in the ER at [facility] shows a-fib with RVR - some lateral ST depressions, this resolved spontaneously. Troponin was 0.03 x 2. Total duration of a-fib was <24 hrs.  Objective:  Temp:  [98.7 F (37.1 C)-99.1 F (37.3 C)] 98.8 F (37.1 C) (11/23 0359) Pulse Rate:  [51-74] 55 (11/23 0400) Resp:  [8-22] 19 (11/23 0400) BP: (135-162)/(40-67) 149/54 (11/23 0400) SpO2:  [90 %-97 %] 96 % (11/23 0400) Weight change: 1 lb 12.2 oz (0.8 kg)  Intake/Output from previous day: 11/22 0701 - 11/23 0700 In: 3303.8 [P.O.:360; I.V.:2843.8; IV Piggyback:100] Out: 1000 [Urine:1000]  Intake/Output from this shift: No intake/output data recorded.  Medications: No current facility-administered medications on file prior to encounter.    Current Outpatient Prescriptions on File Prior to Encounter  Medication Sig Dispense Refill  . calcium citrate (CALCITRATE - DOSED IN MG ELEMENTAL CALCIUM) 950 MG tablet Take 1 tablet by mouth daily.    . enalapril (VASOTEC) 20 MG tablet Take 20 mg by mouth daily.    . metFORMIN (GLUCOPHAGE-XR) 500 MG 24 hr tablet Take 2 tablets by mouth 2 (two) times daily.    . Multiple Vitamin (MULTIVITAMIN) capsule Take 1 capsule by mouth daily.    . Omega-3 Fatty Acids (FISH OIL) 1000 MG CAPS Take 1 capsule by mouth daily.     . pravastatin (PRAVACHOL) 40 MG tablet Take 1 tablet by mouth daily.    Marland Kitchen VICTOZA 18 MG/3ML SOPN Inject 1.8 mg into the skin every morning.     . metoprolol succinate (TOPROL-XL) 100 MG 24 hr tablet Take 50 mg by mouth daily.       Physical Exam: Deferred  Lab Results: Results for orders placed or performed during the hospital encounter of 04/27/16 (from the past 48 hour(s))  Glucose, capillary     Status: Abnormal   Collection Time: 04/28/16  7:56 AM  Result Value Ref Range   Glucose-Capillary 144  (H) 65 - 99 mg/dL  Glucose, capillary     Status: Abnormal   Collection Time: 04/28/16 12:02 PM  Result Value Ref Range   Glucose-Capillary 139 (H) 65 - 99 mg/dL  Glucose, capillary     Status: Abnormal   Collection Time: 04/28/16  4:25 PM  Result Value Ref Range   Glucose-Capillary 122 (H) 65 - 99 mg/dL  Glucose, capillary     Status: Abnormal   Collection Time: 04/28/16  8:02 PM  Result Value Ref Range   Glucose-Capillary 106 (H) 65 - 99 mg/dL  Glucose, capillary     Status: Abnormal   Collection Time: 04/29/16 12:12 AM  Result Value Ref Range   Glucose-Capillary 104 (H) 65 - 99 mg/dL  Glucose, capillary     Status: Abnormal   Collection Time: 04/29/16  4:15 AM  Result Value Ref Range   Glucose-Capillary 105 (H) 65 - 99 mg/dL  Glucose, capillary     Status: None   Collection Time: 04/29/16  7:50 AM  Result Value Ref Range   Glucose-Capillary 91 65 - 99 mg/dL  Glucose, capillary     Status: Abnormal   Collection Time: 04/29/16  4:30 PM  Result Value Ref Range   Glucose-Capillary 120 (H) 65 - 99 mg/dL  Glucose, capillary     Status: Abnormal   Collection Time: 04/29/16  9:47 PM  Result Value Ref Range  Glucose-Capillary 200 (H) 65 - 99 mg/dL   Comment 1 Notify RN   Comprehensive metabolic panel     Status: Abnormal   Collection Time: 04/30/16  3:20 AM  Result Value Ref Range   Sodium 137 135 - 145 mmol/L   Potassium 3.6 3.5 - 5.1 mmol/L   Chloride 108 101 - 111 mmol/L   CO2 22 22 - 32 mmol/L   Glucose, Bld 169 (H) 65 - 99 mg/dL   BUN 19 6 - 20 mg/dL   Creatinine, Ser 0.89 0.44 - 1.00 mg/dL   Calcium 7.7 (L) 8.9 - 10.3 mg/dL   Total Protein 5.2 (L) 6.5 - 8.1 g/dL   Albumin 2.0 (L) 3.5 - 5.0 g/dL   AST 42 (H) 15 - 41 U/L   ALT 86 (H) 14 - 54 U/L   Alkaline Phosphatase 480 (H) 38 - 126 U/L   Total Bilirubin 1.4 (H) 0.3 - 1.2 mg/dL   GFR calc non Af Amer >60 >60 mL/min   GFR calc Af Amer >60 >60 mL/min    Comment: (NOTE) The eGFR has been calculated using the CKD  EPI equation. This calculation has not been validated in all clinical situations. eGFR's persistently <60 mL/min signify possible Chronic Kidney Disease.    Anion gap 7 5 - 15  CBC     Status: Abnormal   Collection Time: 04/30/16  3:20 AM  Result Value Ref Range   WBC 7.1 4.0 - 10.5 K/uL   RBC 3.49 (L) 3.87 - 5.11 MIL/uL   Hemoglobin 9.5 (L) 12.0 - 15.0 g/dL   HCT 29.1 (L) 36.0 - 46.0 %   MCV 83.4 78.0 - 100.0 fL   MCH 27.2 26.0 - 34.0 pg   MCHC 32.6 30.0 - 36.0 g/dL   RDW 13.6 11.5 - 15.5 %   Platelets 225 150 - 400 K/uL  Protime-INR     Status: Abnormal   Collection Time: 04/30/16  3:20 AM  Result Value Ref Range   Prothrombin Time 16.1 (H) 11.4 - 15.2 seconds   INR 1.28   Lipase, blood     Status: Abnormal   Collection Time: 04/30/16  3:20 AM  Result Value Ref Range   Lipase 210 (H) 11 - 51 U/L    Imaging: Dg Ercp  Result Date: 04/29/2016 CLINICAL DATA:  Palpitations EXAM: ERCP TECHNIQUE: Multiple spot images obtained with the fluoroscopic device and submitted for interpretation post-procedure. FLUOROSCOPY TIME:  Fluoroscopy Time:  1 minutes and 53 seconds Radiation Exposure Index (if provided by the fluoroscopic device): Number of Acquired Spot Images: 2 COMPARISON:  None. FINDINGS: Contrast fills the biliary tree and duodenum there are no filling defects in the common bile duct. The common bile duct is mildly dilated. IMPRESSION: See above. These images were submitted for radiologic interpretation only. Please see the procedural report for the amount of contrast and the fluoroscopy time utilized. Electronically Signed   By: Marybelle Killings M.D.   On: 04/29/2016 13:52    Assessment:  1. Active Problems: 2.   Palpitations 3.   HTN (hypertension) 4.   DM2 (diabetes mellitus, type 2) ([place]) 5.   Hyperlipidemia 6.   Choledocholithiasis with obstruction 7.   Atrial fibrillation ([place]) 8.   Choledocholithiasis 9.   Atrial fibrillation with rapid ventricular response ([facility]) 10.    Pancreatitis due to common bile duct stone 11.   Uncontrolled type 2 diabetes mellitus with complication ([place]) 12.   Plan:  1. She is feeling much  better. No recurrent a-fib. She did have a short period of a-fib with RVR on arrival to the ER and converted after being on cardizem. Troponin was 0.03 x 2. She denies any chest pain. I agree that she is low risk for upcoming cholecystectomy. No further testing is necessary. Leukocytosis has resolved and pancreatic/hepatic enzymes are markedly decreased. Hopefully she can have surgery later this week. I'm happy to see in follow-up after discharge. Given the short duration of her a-fib in the setting of acute pancreatitis, it is not likely that she will need long-term anticoagulation - may consider outpatient 30 day monitoring after surgery for evidence of recurrence.  Time Spent Directly with Patient:  15 minutes  Length of Stay:  LOS: 3 days   Pixie Casino, MD, [facility] Attending Cardiologist [place] 04/30/2016, 7:54 AM

## 2016-04-30 NOTE — Progress Notes (Signed)
[place] TEAM 1 - Stepdown/ICU TEAM  Rachel Wong  YQM:[id number] DOB: 1940/08/04 DOA: 04/27/2016 PCP: Raynelle JanSPRY,HEATHER M., MD    Brief Narrative:  [age] yoF w/ a Hx of HTN, PVD, HLD, DM2, and a Diabetic ulcer of left great toe who [facility] with acute mid abdominal pain, nausea, and non-bloody vomiting.  She was in her usual state of health until 2 days prior, developing mild nausea and vomiting, quickly resolving, without any recurrence until early a.m of admission, accompanied by severe pain. Denied diarrhea.  Subjective: The patient is sitting up in a bedside chair with no complaints.  She denies chest pain shortness breath fevers chills nausea or vomiting.  She is tolerating clear liquids without any difficulty.  She had a significant bowel movement this morning.  Assessment & Plan:  Choledocholithiasis with obstruction as evidenced by CT abdomen and pelvis GI following - s/p ERCP noting dilated ducts but ?no apparent stone (report is unclear to me) - Surg following for eventual cholecystectomy this hospitalization   Acute pancreatitis  Due to above - clinically stabilizing - advance diet slowly once timing of GB surgery clear  Rule out Atrial Fibrillation Currently in NSR - no clear evidence of A fib per Cards review of tele records - follow on tele - no indication for anticoag at this time   Acute renal injury Due to above - resolved with hydration  Recent Labs Lab 04/27/16 0307 04/28/16 0211 04/30/16 0320  CREATININE 0.91 1.18* 0.89    HTN  BP trending upward - adjust treatment and follow - holding ACEi until normal diet resumed in setting of AKI  Hyperlipidemia Cont tx once diet resumed    DM2 11/20 A1c 7.5 - CBG reasonably controlled at this time   Grade 2 diastolic dysfunction - newly appreciated No significant volume overload at present - blood pressure control is key   DVT prophylaxis: lovenox  Code Status: FULL CODE Family Communication: no family  present at time of exam  Disposition Plan: awaiting cholecystectomy - stable for transfer to telemetry bed  Cons[facility]ants:  [facility]  [facility]   Procedures: 11/22 TTE - EF 60-65 percent with no wall motion and modalities - grade 2 diastolic dysfunction - moderate pulmonary hypertension at 42 mmHg  Antimicrobials:  Zosyn 11/19 >  Objective: Blood pressure (!) 172/68, pulse 75, temperature 98 F (36.7 C), temperature source Oral, resp. rate 15, height 5\' 6"  (1.676 m), weight 84.1 kg (185 lb 6.5 oz), SpO2 93 %.  Intake/Output Summary (Last 24 hours) at 04/30/16 1101 Last data filed at 04/30/16 1039  Gross per 24 hour  Intake          3099.66 ml  Output             1400 ml  Net          1699.66 ml   Filed Weights   04/27/16 0921 04/28/16 0813 04/29/16 0738  Weight: 80.2 kg (176 lb 12.9 oz) 83.3 kg (183 lb 10.3 oz) 84.1 kg (185 lb 6.5 oz)    Examination: General: No acute respiratory distress Lungs: Clear to auscultation bilaterally without wheezes or crackles Cardiovascular: Regular rate and rhythm without murmur gallop or rub normal S1 and S2 Abdomen: Nontender, nondistended, soft, bowel sounds positive, no rebound, no ascites, no appreciable mass Extremities: No significant cyanosis, clubbing, or edema bilateral lower extremities  CBC:  Recent Labs Lab 04/27/16 0307 04/28/16 0211 04/30/16 0320  WBC 11.6* 12.4* 7.1  NEUTROABS 9.8*  --   --   HGB 11.8* 10.1* 9.5*  HCT 36.1 31.0* 29.1*  MCV 84.9 84.0 83.4  PLT 338 266 225   Basic Metabolic Panel:  Recent Labs Lab 04/27/16 0307 04/28/16 0211 04/30/16 0320  NA 136 141 137  K 3.8 4.3 3.6  CL 98* 105 108  CO2 25 25 22   GLUCOSE 303* 178* 169*  BUN 40* 28* 19  CREATININE 0.91 1.18* 0.89  CALCIUM 9.4 8.2* 7.7*   GFR: Estimated Creatinine Clearance: 58.7 mL/min (by C-G formula based on SCr of 0.89 mg/dL).  Liver Function Tests:  Recent Labs Lab 04/27/16 0307 04/28/16 0211 04/30/16 0320    AST 183* 164* 42*  ALT 180* 172* 86*  ALKPHOS 946* 674* 480*  BILITOT 1.9* 4.1* 1.4*  PROT 7.3 5.5* 5.2*  ALBUMIN 3.7 2.5* 2.0*    Recent Labs Lab 04/27/16 0307 04/30/16 0320  LIPASE 6,436* 210*    Coagulation Profile:  Recent Labs Lab 04/28/16 0211 04/30/16 0320  INR 1.42 1.28    Cardiac Enzymes:  Recent Labs Lab 04/27/16 0710 04/27/16 1130  TROPONINI 0.03* 0.03*    HbA1C: Hgb A1c MFr Bld  Date/Time Value Ref Range Status  04/27/2016 10:58 AM 7.5 (H) 4.8 - 5.6 % Final    Comment:    (NOTE)         Pre-diabetes: 5.7 - 6.4         Diabetes: >6.4         Glycemic control for adults with diabetes: <7.0     CBG:  Recent Labs Lab 04/29/16 0415 04/29/16 0750 04/29/16 1630 04/29/16 2147 04/30/16 0819  GLUCAP 105* 91 120* 200* 189*    Recent Results (from the past 240 hour(s))  MRSA PCR Screening     Status: None   Collection Time: 04/27/16  9:52 AM  Result Value Ref Range Status   MRSA by PCR NEGATIVE NEGATIVE Final    Comment:        The GeneXpert MRSA Assay (FDA approved for NASAL specimens only), is one component of a comprehensive MRSA colonization surveillance program. It is not intended to diagnose MRSA infection nor to guide or monitor treatment for MRSA infections.   Urine culture     Status: Abnormal   Collection Time: 04/27/16 10:17 AM  Result Value Ref Range Status   Specimen Description URINE, CLEAN CATCH  Final   Special Requests NONE  Final   Culture MULTIPLE SPECIES PRESENT, SUGGEST RECOLLECTION (A)  Final   Report Status 04/28/2016 FINAL  Final  Culture, blood (Routine X 2) w Reflex to ID Panel     Status: None (Preliminary result)   Collection Time: 04/27/16 10:48 AM  Result Value Ref Range Status   Specimen Description BLOOD LEFT ANTECUBITAL  Final   Special Requests BOTTLES DRAWN AEROBIC ONLY 5CC  Final   Culture NO GROWTH 2 DAYS  Final   Report Status PENDING  Incomplete  Culture, blood (Routine X 2) w Reflex to ID  Panel     Status: None (Preliminary result)   Collection Time: 04/27/16 10:58 AM  Result Value Ref Range Status   Specimen Description BLOOD BLOOD LEFT ARM  Final   Special Requests BOTTLES DRAWN AEROBIC ONLY 5CC  Final   Culture NO GROWTH 2 DAYS  Final   Report Status PENDING  Incomplete     Scheduled Meds: . enoxaparin (LOVENOX) injection  40 mg Subcutaneous Q24H  . insulin aspart  0-9 Units Subcutaneous TID  WC  . metoprolol succinate  100 mg Oral Daily  . piperacillin-tazobactam (ZOSYN)  IV  3.375 g Intravenous Q8H  . sodium chloride flush  3 mL Intravenous Q12H   Continuous Infusions: . sodium chloride 50 mL/hr at 04/30/16 1037     LOS: 3 days   Lonia BloodJeffrey T. McClung, MD [facility] Office  [phone] Pager - Text Page per Loretha StaplerAmion as per below:  On-Call/Text Page:      [email or web address]      password [email or web address]  If 7PM-7AM, please contact night-coverage [email or web address] Password [email or web address] 04/30/2016, 11:01 AM

## 2016-05-01 LAB — GLUCOSE, CAPILLARY
GLUCOSE-CAPILLARY: 222 mg/dL — AB (ref 65–99)
GLUCOSE-CAPILLARY: 276 mg/dL — AB (ref 65–99)
Glucose-Capillary: 207 mg/dL — ABNORMAL HIGH (ref 65–99)
Glucose-Capillary: 164 mg/dL — ABNORMAL HIGH (ref 65–99)

## 2016-05-01 LAB — COMPREHENSIVE METABOLIC PANEL
ALK PHOS: 410 U/L — AB (ref 38–126)
ALT: 63 U/L — AB (ref 14–54)
AST: 27 U/L (ref 15–41)
Albumin: 2 g/dL — ABNORMAL LOW (ref 3.5–5.0)
Anion gap: 6 (ref 5–15)
BILIRUBIN TOTAL: 0.8 mg/dL (ref 0.3–1.2)
BUN: 14 mg/dL (ref 6–20)
CALCIUM: 7.8 mg/dL — AB (ref 8.9–10.3)
CO2: 23 mmol/L (ref 22–32)
CREATININE: 0.87 mg/dL (ref 0.44–1.00)
Chloride: 107 mmol/L (ref 101–111)
Glucose, Bld: 228 mg/dL — ABNORMAL HIGH (ref 65–99)
Potassium: 3.8 mmol/L (ref 3.5–5.1)
Sodium: 136 mmol/L (ref 135–145)
TOTAL PROTEIN: 4.9 g/dL — AB (ref 6.5–8.1)

## 2016-05-01 LAB — CBC
HCT: 28.6 % — ABNORMAL LOW (ref 36.0–46.0)
HEMOGLOBIN: 9.5 g/dL — AB (ref 12.0–15.0)
MCH: 27.5 pg (ref 26.0–34.0)
MCHC: 33.2 g/dL (ref 30.0–36.0)
MCV: 82.7 fL (ref 78.0–100.0)
PLATELETS: 204 10*3/uL (ref 150–400)
RBC: 3.46 MIL/uL — AB (ref 3.87–5.11)
RDW: 13.6 % (ref 11.5–15.5)
WBC: 5.1 10*3/uL (ref 4.0–10.5)

## 2016-05-01 LAB — LIPASE, BLOOD: LIPASE: 200 U/L — AB (ref 11–51)

## 2016-05-01 MED ORDER — ENALAPRIL MALEATE 20 MG PO TABS
20.0000 mg | ORAL_TABLET | Freq: Every day | ORAL | Status: DC
  Administered 2016-05-01 – 2016-05-04 (×4): 20 mg via ORAL
  Filled 2016-05-01 (×4): qty 1

## 2016-05-01 NOTE — Progress Notes (Signed)
Patient ID: Rachel Wong, female   DOB: [birth year], 75 y.o.   MRN: [id number]  [facility] Progress [facility]  2 Days Post-Op  Subjective: Denies any current abdominal pain, nausea, or vomiting. Tolerated clear liquid diet yesterday. Lipase trending down.  Objective: Vital signs in last 24 hours: Temp:  [98.6 F (37 C)-98.9 F (37.2 C)] 98.6 F (37 C) (11/23 1946) Pulse Rate:  [62-75] 66 (11/23 1946) Resp:  [16] 16 (11/23 1946) BP: (165-172)/(55-68) 168/55 (11/23 1946) SpO2:  [97 %] 97 % (11/23 1946) Last BM Date: 04/30/16  Intake/Output from previous day: 11/23 0701 - 11/24 0700 In: 2208 [P.O.:960; I.V.:1248] Out: 1900 [Urine:1900] Intake/Output this shift: No intake/output data recorded.  PE: Gen:  Alert, NAD, pleasant Pulm:  CTAB Abd: Soft, NT/ND, +BS, no HSM  Lab Results:   Recent Labs  04/30/16 0320 05/01/16 0028  WBC 7.1 5.1  HGB 9.5* 9.5*  HCT 29.1* 28.6*  PLT 225 204   BMET  Recent Labs  04/30/16 1340 05/01/16 0028  NA 136 136  K 3.8 3.8  CL 106 107  CO2 22 23  GLUCOSE 198* 228*  BUN 17 14  CREATININE 0.90 0.87  CALCIUM 8.0* 7.8*   PT/INR  Recent Labs  04/30/16 0320  LABPROT 16.1*  INR 1.28   CMP     Component Value Date/Time   NA 136 05/01/2016 0028   K 3.8 05/01/2016 0028   CL 107 05/01/2016 0028   CO2 23 05/01/2016 0028   GLUCOSE 228 (H) 05/01/2016 0028   BUN 14 05/01/2016 0028   CREATININE 0.87 05/01/2016 0028   CALCIUM 7.8 (L) 05/01/2016 0028   PROT 4.9 (L) 05/01/2016 0028   ALBUMIN 2.0 (L) 05/01/2016 0028   AST 27 05/01/2016 0028   ALT 63 (H) 05/01/2016 0028   ALKPHOS 410 (H) 05/01/2016 0028   BILITOT 0.8 05/01/2016 0028   GFRNONAA >60 05/01/2016 0028   GFRAA >60 05/01/2016 0028   Lipase     Component Value Date/Time   LIPASE 200 (H) 05/01/2016 0028       Studies/Results: Dg Ercp  Result Date: 04/29/2016 CLINICAL DATA:  Palpitations EXAM: ERCP TECHNIQUE: Multiple spot images obtained with the  fluoroscopic device and submitted for interpretation post-procedure. FLUOROSCOPY TIME:  Fluoroscopy Time:  1 minutes and 53 seconds Radiation Exposure Index (if provided by the fluoroscopic device): Number of Acquired Spot Images: 2 COMPARISON:  None. FINDINGS: Contrast fills the biliary tree and duodenum there are no filling defects in the common bile duct. The common bile duct is mildly dilated. IMPRESSION: See above. These images were submitted for radiologic interpretation only. Please see the procedural report for the amount of contrast and the fluoroscopy time utilized. Electronically Signed   By: Jolaine ClickArthur  Hoss M.D.   On: 04/29/2016 13:52    Anti-infectives: Anti-infectives    Start     Dose/Rate Route Frequency Ordered Stop   04/27/16 1400  piperacillin-tazobactam (ZOSYN) IVPB 3.375 g     3.375 g 12.5 mL/hr over 240 Minutes Intravenous Every 8 hours 04/27/16 1017 04/30/16 2359   04/27/16 0615  piperacillin-tazobactam (ZOSYN) IVPB 3.375 g     3.375 g 100 mL/hr over 30 Minutes Intravenous  Once 04/27/16 0609 04/27/16 0700       Assessment/Plan Gallstone pancreatitis Choledocholithiasis  - on admission lipase was found to be 6436, WBC 11.6, total bilirubin 1.9, alkaline phosphatase 946, ALT 180, AST 183 - CT showed choledocholithiasis with severe resultant intrahepatic and extrahepatic biliary dilatation - s/p  ERCP 11/22  New diagnosis atrial fibrillation - currently in NSR, cleared by cardiology as low cardiovascular risk for surgery - 04/29/16 ECHO 60%-65%  HTN PVD HLD DM2 Diabetic ulcer of left great toe   Plan - Lipase trending down but is still 200. Possible OR tomorrow, will give full liquids today and NPO after midnight. Labs in AM   LOS: 4 days    Edson SnowballBROOKE A MILLER , [facility] 05/01/2016, 10:35 AM Pager: [phone] Consults: [phone] Mon-Fri 7:00 am-4:30 pm Sat-Sun 7:00 am-11:30 am

## 2016-05-01 NOTE — Progress Notes (Signed)
Subjective:  No Cp/SOB. Lipase falling. ERCP yesterday. Possible Lap Chole today  Objective:  Temp:  [98.6 F (37 C)-98.9 F (37.2 C)] 98.6 F (37 C) (11/23 1946) Pulse Rate:  [62-75] 66 (11/23 1946) Resp:  [16] 16 (11/23 1946) BP: (165-172)/(55-68) 168/55 (11/23 1946) SpO2:  [97 %] 97 % (11/23 1946) Weight change:   Intake/Output from previous day: 11/23 0701 - 11/24 0700 In: 2208 [P.O.:960; I.V.:1248] Out: 1900 [Urine:1900]  Intake/Output from this shift: No intake/output data recorded.  Physical Exam: General appearance: alert and no distress Neck: no adenopathy, no carotid bruit, no JVD, supple, symmetrical, trachea midline and thyroid not enlarged, symmetric, no tenderness/mass/nodules Lungs: clear to auscultation bilaterally Heart: regular rate and rhythm, S1, S2 normal, no murmur, click, rub or gallop Extremities: extremities normal, atraumatic, no cyanosis or edema  Lab Results: Results for orders placed or performed during the hospital encounter of 04/27/16 (from the past 48 hour(s))  Glucose, capillary     Status: Abnormal   Collection Time: 04/29/16  4:30 PM  Result Value Ref Range   Glucose-Capillary 120 (H) 65 - 99 mg/dL  Glucose, capillary     Status: Abnormal   Collection Time: 04/29/16  9:47 PM  Result Value Ref Range   Glucose-Capillary 200 (H) 65 - 99 mg/dL   Comment 1 Notify RN   Comprehensive metabolic panel     Status: Abnormal   Collection Time: 04/30/16  3:20 AM  Result Value Ref Range   Sodium 137 135 - 145 mmol/L   Potassium 3.6 3.5 - 5.1 mmol/L   Chloride 108 101 - 111 mmol/L   CO2 22 22 - 32 mmol/L   Glucose, Bld 169 (H) 65 - 99 mg/dL   BUN 19 6 - 20 mg/dL   Creatinine, Ser 0.89 0.44 - 1.00 mg/dL   Calcium 7.7 (L) 8.9 - 10.3 mg/dL   Total Protein 5.2 (L) 6.5 - 8.1 g/dL   Albumin 2.0 (L) 3.5 - 5.0 g/dL   AST 42 (H) 15 - 41 U/L   ALT 86 (H) 14 - 54 U/L   Alkaline Phosphatase 480 (H) 38 - 126 U/L   Total Bilirubin 1.4 (H) 0.3 -  1.2 mg/dL   GFR calc non Af Amer >60 >60 mL/min   GFR calc Af Amer >60 >60 mL/min    Comment: (NOTE) The eGFR has been calculated using the CKD EPI equation. This calculation has not been validated in all clinical situations. eGFR's persistently <60 mL/min signify possible Chronic Kidney Disease.    Anion gap 7 5 - 15  CBC     Status: Abnormal   Collection Time: 04/30/16  3:20 AM  Result Value Ref Range   WBC 7.1 4.0 - 10.5 K/uL   RBC 3.49 (L) 3.87 - 5.11 MIL/uL   Hemoglobin 9.5 (L) 12.0 - 15.0 g/dL   HCT 29.1 (L) 36.0 - 46.0 %   MCV 83.4 78.0 - 100.0 fL   MCH 27.2 26.0 - 34.0 pg   MCHC 32.6 30.0 - 36.0 g/dL   RDW 13.6 11.5 - 15.5 %   Platelets 225 150 - 400 K/uL  Protime-INR     Status: Abnormal   Collection Time: 04/30/16  3:20 AM  Result Value Ref Range   Prothrombin Time 16.1 (H) 11.4 - 15.2 seconds   INR 1.28   Lipase, blood     Status: Abnormal   Collection Time: 04/30/16  3:20 AM  Result Value Ref Range   Lipase  210 (H) 11 - 51 U/L  Glucose, capillary     Status: Abnormal   Collection Time: 04/30/16  8:19 AM  Result Value Ref Range   Glucose-Capillary 189 (H) 65 - 99 mg/dL  Glucose, capillary     Status: Abnormal   Collection Time: 04/30/16  1:28 PM  Result Value Ref Range   Glucose-Capillary 213 (H) 65 - 99 mg/dL  Comprehensive metabolic panel     Status: Abnormal   Collection Time: 04/30/16  1:40 PM  Result Value Ref Range   Sodium 136 135 - 145 mmol/L   Potassium 3.8 3.5 - 5.1 mmol/L   Chloride 106 101 - 111 mmol/L   CO2 22 22 - 32 mmol/L   Glucose, Bld 198 (H) 65 - 99 mg/dL   BUN 17 6 - 20 mg/dL   Creatinine, Ser 0.90 0.44 - 1.00 mg/dL   Calcium 8.0 (L) 8.9 - 10.3 mg/dL   Total Protein 5.8 (L) 6.5 - 8.1 g/dL   Albumin 2.2 (L) 3.5 - 5.0 g/dL   AST 42 (H) 15 - 41 U/L   ALT 86 (H) 14 - 54 U/L   Alkaline Phosphatase 493 (H) 38 - 126 U/L   Total Bilirubin 1.4 (H) 0.3 - 1.2 mg/dL   GFR calc non Af Amer >60 >60 mL/min   GFR calc Af Amer >60 >60 mL/min     Comment: (NOTE) The eGFR has been calculated using the CKD EPI equation. This calculation has not been validated in all clinical situations. eGFR's persistently <60 mL/min signify possible Chronic Kidney Disease.    Anion gap 8 5 - 15  Glucose, capillary     Status: Abnormal   Collection Time: 04/30/16  5:11 PM  Result Value Ref Range   Glucose-Capillary 249 (H) 65 - 99 mg/dL   Comment 1 Notify RN   Glucose, capillary     Status: Abnormal   Collection Time: 04/30/16 10:36 PM  Result Value Ref Range   Glucose-Capillary 238 (H) 65 - 99 mg/dL  CBC     Status: Abnormal   Collection Time: 05/01/16 12:28 AM  Result Value Ref Range   WBC 5.1 4.0 - 10.5 K/uL   RBC 3.46 (L) 3.87 - 5.11 MIL/uL   Hemoglobin 9.5 (L) 12.0 - 15.0 g/dL   HCT 28.6 (L) 36.0 - 46.0 %   MCV 82.7 78.0 - 100.0 fL   MCH 27.5 26.0 - 34.0 pg   MCHC 33.2 30.0 - 36.0 g/dL   RDW 13.6 11.5 - 15.5 %   Platelets 204 150 - 400 K/uL  Comprehensive metabolic panel     Status: Abnormal   Collection Time: 05/01/16 12:28 AM  Result Value Ref Range   Sodium 136 135 - 145 mmol/L   Potassium 3.8 3.5 - 5.1 mmol/L   Chloride 107 101 - 111 mmol/L   CO2 23 22 - 32 mmol/L   Glucose, Bld 228 (H) 65 - 99 mg/dL   BUN 14 6 - 20 mg/dL   Creatinine, Ser 0.87 0.44 - 1.00 mg/dL   Calcium 7.8 (L) 8.9 - 10.3 mg/dL   Total Protein 4.9 (L) 6.5 - 8.1 g/dL   Albumin 2.0 (L) 3.5 - 5.0 g/dL   AST 27 15 - 41 U/L   ALT 63 (H) 14 - 54 U/L   Alkaline Phosphatase 410 (H) 38 - 126 U/L   Total Bilirubin 0.8 0.3 - 1.2 mg/dL   GFR calc non Af Amer >60 >60 mL/min   GFR  calc Af Amer >60 >60 mL/min    Comment: (NOTE) The eGFR has been calculated using the CKD EPI equation. This calculation has not been validated in all clinical situations. eGFR's persistently <60 mL/min signify possible Chronic Kidney Disease.    Anion gap 6 5 - 15  Lipase, blood     Status: Abnormal   Collection Time: 05/01/16 12:28 AM  Result Value Ref Range   Lipase 200 (H) 11  - 51 U/L  Glucose, capillary     Status: Abnormal   Collection Time: 05/01/16  8:03 AM  Result Value Ref Range   Glucose-Capillary 207 (H) 65 - 99 mg/dL    Imaging: Imaging results have been reviewed  Tele- NSR with rare PVCs  Assessment/Plan:   1. Active Problems: 2.   Palpitations 3.   HTN (hypertension) 4.   DM2 (diabetes mellitus, type 2) ([place]) 5.   Hyperlipidemia 6.   Choledocholithiasis with obstruction 7.   Atrial fibrillation ([place]) 8.   Choledocholithiasis 9.   Atrial fibrillation with rapid ventricular response ([facility]) 10.   Pancreatitis due to common bile duct stone 11.   Uncontrolled type 2 diabetes mellitus with complication ([place]) 12.   Time Spent Directly with Patient:  15 minutes  Length of Stay:  LOS: 4 days   S/P ERCP w/o retained stones. Lipase has precipitously fallen. WBC down as well. No CP/SOB, abd pain, N/V. Exam benign. NSR with occasional PVCs. For possible Lap /Chole today. Cleared at low risk. Will see again post -op.   Quay Burow 05/01/2016, 8:15 AM

## 2016-05-01 NOTE — Progress Notes (Signed)
Physical Therapy Treatment Patient Details Name: Rachel ElksRalice Loudin MRN: [id number] DOB: 1940/06/20 Today's Date: 05/01/2016    History of Present Illness 75 y.o. female with a Past Medical History of significant for HTN, HLD who presents with abd pain. GI onboard. ERCP likely in next 1-2 days for choledocholithiasis followed by GB removal    PT Comments    Pt presents with improved demeanor and ability to increased gait distance this session. Pt is awaiting a scheduled surgery pending labs regarding pancreatic enzymes. Pt advised to use RW when performing gait in room in order to improve safety due to right toe amputations. Pt will benefit from continuing to be seen acutely in order to assist with smooth transition following surgery.    Follow Up Recommendations  SNF;Home health PT     Equipment Recommendations  None recommended by PT    Recommendations for Other Services       Precautions / Restrictions Precautions Precautions: Fall Precaution Comments: wears custom shoes due to toe amuptations Restrictions Weight Bearing Restrictions: No    Mobility  Bed Mobility Overal bed mobility: Needs Assistance Bed Mobility: Supine to Sit     Supine to sit: Supervision     General bed mobility comments: supervision for line management and safety  Transfers Overall transfer level: Needs assistance Equipment used: None Transfers: Sit to/from Stand Sit to Stand: Min guard         General transfer comment: min guard for safety from EOB  Ambulation/Gait Ambulation/Gait assistance: Min guard Ambulation Distance (Feet): 150 Feet Assistive device: 1 person hand held assist Gait Pattern/deviations: Step-through pattern Gait velocity: decreased Gait velocity interpretation: Below normal speed for age/gender General Gait Details: Requires HHA for safety due to some instability. Pt will benefit from RW use for next attempt in order to improve safety. Advised pt to use RW with  gait   Stairs            Wheelchair Mobility    Modified Rankin (Stroke Patients Only)       Balance Overall balance assessment: Needs assistance Sitting-balance support: No upper extremity supported Sitting balance-Leahy Scale: Good     Standing balance support: Single extremity supported Standing balance-Leahy Scale: Fair Standing balance comment: Requires min guard for safety in standing                    Cognition Arousal/Alertness: Awake/alert Behavior During Therapy: WFL for tasks assessed/performed Overall Cognitive Status: Within Functional Limits for tasks assessed                      Exercises General Exercises - Lower Extremity Ankle Circles/Pumps: AROM;20 reps;Both;Seated Long Arc Quad: AROM;Both;10 reps;Seated Hip ABduction/ADduction: AROM;Both;10 reps;Seated Hip Flexion/Marching: AROM;Both;10 reps;Seated    General Comments General comments (skin integrity, edema, etc.): Dtr present at end of session      Pertinent Vitals/Pain Pain Assessment: No/denies pain    Home Living                      Prior Function            PT Goals (current goals can now be found in the care plan section) Acute Rehab PT Goals Patient Stated Goal: to get better Progress towards PT goals: Progressing toward goals    Frequency    Min 3X/week      PT Plan Current plan remains appropriate    Co-evaluation  End of Session Equipment Utilized During Treatment: Gait belt Activity Tolerance: Patient tolerated treatment well Patient left: in chair;with call bell/phone within reach;with family/visitor present     Time: [id number] PT Time Calculation (min) (ACUTE ONLY): 25 min  Charges:  $Gait Training: 23-37 mins                    G Codes:      Colin BroachSabra M. Vinetta Brach PT, DPT  [phone]  05/01/2016, 12:40 PM

## 2016-05-01 NOTE — Progress Notes (Signed)
PROGRESS NOTE [facility]   AllisonRalice Seebeck   YQM:[id number]:[id number] DOB: [birth year]  DOA: 04/27/2016 PCP: Raynelle JanSPRY,HEATHER M., MD   Brief Narrative:  [age] yoF w/ a Hx of HTN,PVD,HLD, DM2, and a Diabetic ulcer of left great toe who [facility] with acute mid abdominal pain, nausea, and non-bloody vomiting. Patient was admitted for choledocholithiasis with obstruction, gallstones pancreatitis. GI was consulted ERCP was done with sphincterectomy and papillotomy. Surgery was consulted for cholecystectomy.  Subjective: Patient seen and examined his morning. Symptomatic. Remains afebrile. Lipase trending down LFTs trending down. No acute events overnight. Patient tolerating diet well. For cholecystectomy tomorrow.  Assessment & Plan:  Choledocholithiasis with obstruction as evidenced by CT abdomen and pelvis GI following - s/p ERCP noting dilated ducts -  Surgery following for cholecystectomy tomorrow. Full liquid diet today, nothing by mouth after midnight.  Acute pancreatitis secondary to gallstone Status post ERCP Lipase trending down  Atrial Fibrillation with RVR short duration converted to sinus rhythm after Cardizem Cardio recommendations appreciated No anticoagulation needed- cardiology will follow as an outpatient okay to proceed with surgery.  Acute renal injury Due to above - resolved with hydration  HTN - trending up patient was off ACE inhibitor due to acute kidney injury. We'll resume enalapril On metoprolol Monitor BP  Dm2 stable CBGs controlled Sliding-scale  Grade 2 diastolic dysfunction - newly appreciated No signs of fluid overload  DVT prophylaxis: Lovenox  Code Status: (Full Family Communication: None bedside") Disposition Plan: Home when medically stable  Consultants:   [facility]  [facility]  Gen. surgery  Procedures: 11/22 TTE - EF 60-65 percent with no wall motion and modalities - grade 2 diastolic dysfunction - moderate pulmonary  hypertension at 42 mmHg  Antimicrobials:  Zosyn 11/19   Objective: Vitals:   04/30/16 0800 04/30/16 1038 04/30/16 1250 04/30/16 1946  BP: (!) 168/65 (!) 172/68 (!) 165/68 (!) 168/55  Pulse: 63 75 62 66  Resp: 15  16 16   Temp: 98 F (36.7 C)  98.9 F (37.2 C) 98.6 F (37 C)  TempSrc: Oral  Oral Oral  SpO2: 93%  97% 97%  Weight:      Height:        Intake/Output Summary (Last 24 hours) at 05/01/16 1315 Last data filed at 05/01/16 0554  Gross per 24 hour  Intake          1743.33 ml  Output             1500 ml  Net           243.33 ml   Filed Weights   04/27/16 0921 04/28/16 0813 04/29/16 0738  Weight: 80.2 kg (176 lb 12.9 oz) 83.3 kg (183 lb 10.3 oz) 84.1 kg (185 lb 6.5 oz)    Examination:  General exam: Appears calm and comfortable  Respiratory system: Clear to auscultation. No wheezes,crackle or rhonchi Cardiovascular system: S1 & S2 heard, RRR. No JVD, murmurs, rubs or gallops Gastrointestinal system: Abdomen is nondistended, soft and nontender. No organomegaly or masses felt. Normal bowel sounds heard. Central nervous system: Alert and oriented. No focal neurological deficits. Extremities: No pedal edema. Symmetric, strength 5/5   Skin: No rashes, lesions or ulcers Psychiatry: Judgement and insight appear normal. Mood & affect appropriate.    Data Reviewed: I have personally reviewed following labs and imaging studies  CBC:  Recent Labs Lab 04/27/16 0307 04/28/16 0211 04/30/16 0320 05/01/16 0028  WBC 11.6* 12.4* 7.1 5.1  NEUTROABS 9.8*  --   --   --  HGB 11.8* 10.1* 9.5* 9.5*  HCT 36.1 31.0* 29.1* 28.6*  MCV 84.9 84.0 83.4 82.7  PLT 338 266 225 204   Basic Metabolic Panel:  Recent Labs Lab 04/27/16 0307 04/28/16 0211 04/30/16 0320 04/30/16 1340 05/01/16 0028  NA 136 141 137 136 136  K 3.8 4.3 3.6 3.8 3.8  CL 98* 105 108 106 107  CO2 25 25 22 22 23   GLUCOSE 303* 178* 169* 198* 228*  BUN 40* 28* 19 17 14   CREATININE 0.91 1.18* 0.89 0.90  0.87  CALCIUM 9.4 8.2* 7.7* 8.0* 7.8*   GFR: Estimated Creatinine Clearance: 60.1 mL/min (by C-G formula based on SCr of 0.87 mg/dL). Liver Function Tests:  Recent Labs Lab 04/27/16 0307 04/28/16 0211 04/30/16 0320 04/30/16 1340 05/01/16 0028  AST 183* 164* 42* 42* 27  ALT 180* 172* 86* 86* 63*  ALKPHOS 946* 674* 480* 493* 410*  BILITOT 1.9* 4.1* 1.4* 1.4* 0.8  PROT 7.3 5.5* 5.2* 5.8* 4.9*  ALBUMIN 3.7 2.5* 2.0* 2.2* 2.0*    Recent Labs Lab 04/27/16 0307 04/30/16 0320 05/01/16 0028  LIPASE 6,436* 210* 200*   No results for input(s): AMMONIA in the last 168 hours. Coagulation Profile:  Recent Labs Lab 04/28/16 0211 04/30/16 0320  INR 1.42 1.28   Cardiac Enzymes:  Recent Labs Lab 04/27/16 0710 04/27/16 1130  TROPONINI 0.03* 0.03*   BNP (last 3 results) No results for input(s): PROBNP in the last 8760 hours. HbA1C: No results for input(s): HGBA1C in the last 72 hours. CBG:  Recent Labs Lab 04/30/16 1328 04/30/16 1711 04/30/16 2236 05/01/16 0803 05/01/16 1250  GLUCAP 213* 249* 238* 207* 164*   Lipid Profile: No results for input(s): CHOL, HDL, LDLCALC, TRIG, CHOLHDL, LDLDIRECT in the last 72 hours. Thyroid Function Tests: No results for input(s): TSH, T4TOTAL, FREET4, T3FREE, THYROIDAB in the last 72 hours. Anemia Panel: No results for input(s): VITAMINB12, FOLATE, FERRITIN, TIBC, IRON, RETICCTPCT in the last 72 hours. Sepsis Labs:  Recent Labs Lab 04/27/16 0717 04/27/16 1130 04/27/16 1440  LATICACIDVEN 1.25 1.2 1.5    Recent Results (from the past 240 hour(s))  MRSA PCR Screening     Status: None   Collection Time: 04/27/16  9:52 AM  Result Value Ref Range Status   MRSA by PCR NEGATIVE NEGATIVE Final    Comment:        The GeneXpert MRSA Assay (FDA approved for NASAL specimens only), is one component of a comprehensive MRSA colonization surveillance program. It is not intended to diagnose MRSA infection nor to guide or monitor  treatment for MRSA infections.   Urine culture     Status: Abnormal   Collection Time: 04/27/16 10:17 AM  Result Value Ref Range Status   Specimen Description URINE, CLEAN CATCH  Final   Special Requests NONE  Final   Culture MULTIPLE SPECIES PRESENT, SUGGEST RECOLLECTION (A)  Final   Report Status 04/28/2016 FINAL  Final  Culture, blood (Routine X 2) w Reflex to ID Panel     Status: None (Preliminary result)   Collection Time: 04/27/16 10:48 AM  Result Value Ref Range Status   Specimen Description BLOOD LEFT ANTECUBITAL  Final   Special Requests BOTTLES DRAWN AEROBIC ONLY 5CC  Final   Culture NO GROWTH 3 DAYS  Final   Report Status PENDING  Incomplete  Culture, blood (Routine X 2) w Reflex to ID Panel     Status: None (Preliminary result)   Collection Time: 04/27/16 10:58 AM  Result Value Ref  Range Status   Specimen Description BLOOD BLOOD LEFT ARM  Final   Special Requests BOTTLES DRAWN AEROBIC ONLY 5CC  Final   Culture NO GROWTH 3 DAYS  Final   Report Status PENDING  Incomplete      Radiology Studies: Dg Ercp  Result Date: 04/29/2016 CLINICAL DATA:  Palpitations EXAM: ERCP TECHNIQUE: Multiple spot images obtained with the fluoroscopic device and submitted for interpretation post-procedure. FLUOROSCOPY TIME:  Fluoroscopy Time:  1 minutes and 53 seconds Radiation Exposure Index (if provided by the fluoroscopic device): Number of Acquired Spot Images: 2 COMPARISON:  None. FINDINGS: Contrast fills the biliary tree and duodenum there are no filling defects in the common bile duct. The common bile duct is mildly dilated. IMPRESSION: See above. These images were submitted for radiologic interpretation only. Please see the procedural report for the amount of contrast and the fluoroscopy time utilized. Electronically Signed   By: Jolaine ClickArthur  Hoss M.D.   On: 04/29/2016 13:52    Scheduled Meds: . enoxaparin (LOVENOX) injection  40 mg Subcutaneous Q24H  . insulin aspart  0-9 Units Subcutaneous  TID WC  . metoprolol succinate  100 mg Oral Daily  . sodium chloride flush  3 mL Intravenous Q12H   Continuous Infusions: . sodium chloride 50 mL/hr at 04/30/16 1037     LOS: 4 days    Latrelle DodrillEdwin Silva, MD [facility] Pager [phone]  If 7PM-7AM, please contact night-coverage [email or web address] Password [email or web address] 05/01/2016, 1:15 PM

## 2016-05-02 ENCOUNTER — Encounter (HOSPITAL_COMMUNITY)
Admission: EM | Disposition: A | Discharge status: Skilled Nursing Facility | Payer: Self-pay | Source: Home / Self Care | Attending: Family Medicine

## 2016-05-02 ENCOUNTER — Inpatient Hospital Stay (HOSPITAL_COMMUNITY): Payer: Medicare Other | Admitting: Anesthesiology

## 2016-05-02 ENCOUNTER — Encounter (HOSPITAL_COMMUNITY): Payer: Self-pay | Admitting: Anesthesiology

## 2016-05-02 HISTORY — PX: CHOLECYSTECTOMY: SHX55

## 2016-05-02 LAB — COMPREHENSIVE METABOLIC PANEL
ALBUMIN: 2.1 g/dL — AB (ref 3.5–5.0)
ALK PHOS: 367 U/L — AB (ref 38–126)
ALK PHOS: 335 U/L — AB (ref 38–126)
ALT: 47 U/L (ref 14–54)
ALT: 60 U/L — ABNORMAL HIGH (ref 14–54)
ANION GAP: 9 (ref 5–15)
AST: 23 U/L (ref 15–41)
AST: 80 U/L — ABNORMAL HIGH (ref 15–41)
Albumin: 2.1 g/dL — ABNORMAL LOW (ref 3.5–5.0)
Anion gap: 8 (ref 5–15)
BUN: 10 mg/dL (ref 6–20)
BUN: 9 mg/dL (ref 6–20)
CALCIUM: 8.3 mg/dL — AB (ref 8.9–10.3)
CALCIUM: 7.9 mg/dL — AB (ref 8.9–10.3)
CHLORIDE: 107 mmol/L (ref 101–111)
CO2: 23 mmol/L (ref 22–32)
CO2: 22 mmol/L (ref 22–32)
CREATININE: 0.73 mg/dL (ref 0.44–1.00)
Chloride: 106 mmol/L (ref 101–111)
Creatinine, Ser: 0.91 mg/dL (ref 0.44–1.00)
GFR calc non Af Amer: >60 mL/min (ref >60)
GFR calc non Af Amer: 60 mL/min — ABNORMAL LOW (ref >60)
GLUCOSE: 222 mg/dL — AB (ref 65–99)
Glucose, Bld: 260 mg/dL — ABNORMAL HIGH (ref 65–99)
Potassium: 3.6 mmol/L (ref 3.5–5.1)
Potassium: 3.9 mmol/L (ref 3.5–5.1)
SODIUM: 138 mmol/L (ref 135–145)
SODIUM: 137 mmol/L (ref 135–145)
Total Bilirubin: 0.6 mg/dL (ref 0.3–1.2)
Total Bilirubin: 0.5 mg/dL (ref 0.3–1.2)
Total Protein: 5.1 g/dL — ABNORMAL LOW (ref 6.5–8.1)
Total Protein: 5.1 g/dL — ABNORMAL LOW (ref 6.5–8.1)

## 2016-05-02 LAB — CULTURE, BLOOD (ROUTINE X 2)
CULTURE: NO GROWTH
Culture: NO GROWTH

## 2016-05-02 LAB — CBC
HCT: 28.8 % — ABNORMAL LOW (ref 36.0–46.0)
Hemoglobin: 9.7 g/dL — ABNORMAL LOW (ref 12.0–15.0)
MCH: 27.7 pg (ref 26.0–34.0)
MCHC: 33.7 g/dL (ref 30.0–36.0)
MCV: 82.3 fL (ref 78.0–100.0)
PLATELETS: 225 10*3/uL (ref 150–400)
RBC: 3.5 MIL/uL — ABNORMAL LOW (ref 3.87–5.11)
RDW: 13.3 % (ref 11.5–15.5)
WBC: 4.9 10*3/uL (ref 4.0–10.5)

## 2016-05-02 LAB — GLUCOSE, CAPILLARY
GLUCOSE-CAPILLARY: 226 mg/dL — AB (ref 65–99)
GLUCOSE-CAPILLARY: 232 mg/dL — AB (ref 65–99)
Glucose-Capillary: 187 mg/dL — ABNORMAL HIGH (ref 65–99)
Glucose-Capillary: 243 mg/dL — ABNORMAL HIGH (ref 65–99)

## 2016-05-02 LAB — LIPASE, BLOOD: LIPASE: 124 U/L — AB (ref 11–51)

## 2016-05-02 SURGERY — LAPAROSCOPIC CHOLECYSTECTOMY WITH INTRAOPERATIVE CHOLANGIOGRAM
Anesthesia: General | Site: Abdomen

## 2016-05-02 MED ORDER — EPHEDRINE 5 MG/ML INJ
INTRAVENOUS | Status: AC
  Filled 2016-05-02: qty 10

## 2016-05-02 MED ORDER — DEXTROSE 5 % IV SOLN
2.0000 g | INTRAVENOUS | Status: AC
  Administered 2016-05-02 – 2016-05-03 (×2): 2 g via INTRAVENOUS
  Filled 2016-05-02 (×2): qty 2

## 2016-05-02 MED ORDER — ONDANSETRON HCL 4 MG/2ML IJ SOLN
INTRAMUSCULAR | Status: DC | PRN
  Administered 2016-05-02: 4 mg via INTRAVENOUS

## 2016-05-02 MED ORDER — BUPIVACAINE-EPINEPHRINE 0.25% -1:200000 IJ SOLN
INTRAMUSCULAR | Status: DC | PRN
  Administered 2016-05-02: 10 mL

## 2016-05-02 MED ORDER — SUGAMMADEX SODIUM 200 MG/2ML IV SOLN
INTRAVENOUS | Status: AC
  Filled 2016-05-02: qty 2

## 2016-05-02 MED ORDER — MIDAZOLAM HCL 2 MG/2ML IJ SOLN
INTRAMUSCULAR | Status: AC
  Filled 2016-05-02: qty 2

## 2016-05-02 MED ORDER — LIDOCAINE 2% (20 MG/ML) 5 ML SYRINGE
INTRAMUSCULAR | Status: AC
  Filled 2016-05-02: qty 5

## 2016-05-02 MED ORDER — SODIUM CHLORIDE 0.9 % IR SOLN
Status: DC | PRN
  Administered 2016-05-02: 1000 mL

## 2016-05-02 MED ORDER — LACTATED RINGERS IV SOLN
INTRAVENOUS | Status: DC | PRN
  Administered 2016-05-02 (×2): via INTRAVENOUS

## 2016-05-02 MED ORDER — ONDANSETRON HCL 4 MG/2ML IJ SOLN
INTRAMUSCULAR | Status: AC
  Filled 2016-05-02: qty 2

## 2016-05-02 MED ORDER — LIDOCAINE HCL (CARDIAC) 20 MG/ML IV SOLN
INTRAVENOUS | Status: DC | PRN
  Administered 2016-05-02: 100 mg via INTRAVENOUS

## 2016-05-02 MED ORDER — EPHEDRINE SULFATE 50 MG/ML IJ SOLN
INTRAMUSCULAR | Status: DC | PRN
  Administered 2016-05-02: 5 mg via INTRAVENOUS
  Administered 2016-05-02: 10 mg via INTRAVENOUS

## 2016-05-02 MED ORDER — PHENYLEPHRINE HCL 10 MG/ML IJ SOLN
INTRAMUSCULAR | Status: DC | PRN
  Administered 2016-05-02: 80 ug via INTRAVENOUS
  Administered 2016-05-02: 40 ug via INTRAVENOUS

## 2016-05-02 MED ORDER — PHENYLEPHRINE HCL 10 MG/ML IJ SOLN
INTRAMUSCULAR | Status: DC | PRN
  Administered 2016-05-02: 10 ug/min via INTRAVENOUS

## 2016-05-02 MED ORDER — PROPOFOL 10 MG/ML IV BOLUS
INTRAVENOUS | Status: AC
  Filled 2016-05-02: qty 40

## 2016-05-02 MED ORDER — PHENYLEPHRINE 40 MCG/ML (10ML) SYRINGE FOR IV PUSH (FOR BLOOD PRESSURE SUPPORT)
PREFILLED_SYRINGE | INTRAVENOUS | Status: AC
  Filled 2016-05-02: qty 10

## 2016-05-02 MED ORDER — ROCURONIUM BROMIDE 10 MG/ML (PF) SYRINGE
PREFILLED_SYRINGE | INTRAVENOUS | Status: AC
  Filled 2016-05-02: qty 10

## 2016-05-02 MED ORDER — IOPAMIDOL (ISOVUE-300) INJECTION 61%
INTRAVENOUS | Status: AC
  Filled 2016-05-02: qty 50

## 2016-05-02 MED ORDER — MEPERIDINE HCL 25 MG/ML IJ SOLN: 6.2500 mg | INTRAMUSCULAR | Status: DC | PRN

## 2016-05-02 MED ORDER — ARTIFICIAL TEARS OP OINT
TOPICAL_OINTMENT | OPHTHALMIC | Status: AC
  Filled 2016-05-02: qty 3.5

## 2016-05-02 MED ORDER — HYDROMORPHONE HCL 1 MG/ML IJ SOLN
0.2500 mg | INTRAMUSCULAR | Status: DC | PRN
  Administered 2016-05-02 (×4): 0.5 mg via INTRAVENOUS

## 2016-05-02 MED ORDER — FENTANYL CITRATE (PF) 100 MCG/2ML IJ SOLN
INTRAMUSCULAR | Status: AC
  Filled 2016-05-02: qty 2

## 2016-05-02 MED ORDER — GLYCOPYRROLATE 0.2 MG/ML IJ SOLN
INTRAMUSCULAR | Status: DC | PRN
  Administered 2016-05-02: 0.2 mg via INTRAVENOUS

## 2016-05-02 MED ORDER — ONDANSETRON HCL 4 MG/2ML IJ SOLN
4.0000 mg | Freq: Once | INTRAMUSCULAR | Status: AC | PRN
  Administered 2016-05-02: 4 mg via INTRAVENOUS

## 2016-05-02 MED ORDER — PROPOFOL 10 MG/ML IV BOLUS
INTRAVENOUS | Status: DC | PRN
  Administered 2016-05-02: 120 mg via INTRAVENOUS

## 2016-05-02 MED ORDER — SUGAMMADEX SODIUM 200 MG/2ML IV SOLN
INTRAVENOUS | Status: DC | PRN
  Administered 2016-05-02: 200 mg via INTRAVENOUS

## 2016-05-02 MED ORDER — HYDROMORPHONE HCL 2 MG/ML IJ SOLN
INTRAMUSCULAR | Status: AC
  Filled 2016-05-02: qty 1

## 2016-05-02 MED ORDER — ROCURONIUM BROMIDE 100 MG/10ML IV SOLN
INTRAVENOUS | Status: DC | PRN
  Administered 2016-05-02 (×2): 5 mg via INTRAVENOUS
  Administered 2016-05-02: 50 mg via INTRAVENOUS

## 2016-05-02 MED ORDER — FENTANYL CITRATE (PF) 100 MCG/2ML IJ SOLN
INTRAMUSCULAR | Status: DC | PRN
  Administered 2016-05-02 (×2): 50 ug via INTRAVENOUS

## 2016-05-02 MED ORDER — 0.9 % SODIUM CHLORIDE (POUR BTL) OPTIME
TOPICAL | Status: DC | PRN
  Administered 2016-05-02: 1000 mL

## 2016-05-02 MED ORDER — CEFAZOLIN SODIUM 1 G IJ SOLR
INTRAMUSCULAR | Status: DC | PRN
Start: 2016-05-02 — End: 2016-05-02
  Administered 2016-05-02: 2 g via INTRAMUSCULAR

## 2016-05-02 MED ORDER — BUPIVACAINE HCL (PF) 0.25 % IJ SOLN
INTRAMUSCULAR | Status: AC
  Filled 2016-05-02: qty 30

## 2016-05-02 SURGICAL SUPPLY — 54 items
ADH SKN CLS APL DERMABOND .7 (GAUZE/BANDAGES/DRESSINGS) ×1
APPLIER CLIP 5 13 M/L LIGAMAX5 (MISCELLANEOUS) ×3
APPLIER CLIP ROT 10 11.4 M/L (STAPLE) ×3
APR CLP MED LRG 11.4X10 (STAPLE) ×1
APR CLP MED LRG 5 ANG JAW (MISCELLANEOUS) ×1
BAG SPEC RTRVL 10 TROC 200 (ENDOMECHANICALS) ×1
BLADE SURG ROTATE 9660 (MISCELLANEOUS) IMPLANT
CANISTER SUCTION 2500CC (MISCELLANEOUS) ×3 IMPLANT
CHLORAPREP W/TINT 26ML (MISCELLANEOUS) ×3 IMPLANT
CLIP APPLIE 5 13 M/L LIGAMAX5 (MISCELLANEOUS) ×1 IMPLANT
CLIP APPLIE ROT 10 11.4 M/L (STAPLE) IMPLANT
COVER MAYO STAND STRL (DRAPES) ×3 IMPLANT
COVER SURGICAL LIGHT HANDLE (MISCELLANEOUS) ×3 IMPLANT
DERMABOND ADVANCED (GAUZE/BANDAGES/DRESSINGS) ×2
DERMABOND ADVANCED .7 DNX12 (GAUZE/BANDAGES/DRESSINGS) ×1 IMPLANT
DRAPE C-ARM 42X72 X-RAY (DRAPES) ×3 IMPLANT
ELECT REM PT RETURN 9FT ADLT (ELECTROSURGICAL) ×3
ELECTRODE REM PT RTRN 9FT ADLT (ELECTROSURGICAL) ×1 IMPLANT
FILTER SMOKE EVAC LAPAROSHD (FILTER) ×2 IMPLANT
GLOVE BIO SURGEON STRL SZ7 (GLOVE) ×2 IMPLANT
GLOVE BIO SURGEON STRL SZ7.5 (GLOVE) ×2 IMPLANT
GLOVE BIO SURGEON STRL SZ8 (GLOVE) ×3 IMPLANT
GLOVE BIOGEL PI IND STRL 7.0 (GLOVE) IMPLANT
GLOVE BIOGEL PI IND STRL 8 (GLOVE) ×1 IMPLANT
GLOVE BIOGEL PI INDICATOR 7.0 (GLOVE) ×2
GLOVE BIOGEL PI INDICATOR 8 (GLOVE) ×2
GOWN STRL REUS W/ TWL LRG LVL3 (GOWN DISPOSABLE) ×2 IMPLANT
GOWN STRL REUS W/ TWL XL LVL3 (GOWN DISPOSABLE) ×1 IMPLANT
GOWN STRL REUS W/TWL LRG LVL3 (GOWN DISPOSABLE) ×9
GOWN STRL REUS W/TWL XL LVL3 (GOWN DISPOSABLE) ×3
KIT BASIN OR (CUSTOM PROCEDURE TRAY) ×3 IMPLANT
KIT ROOM TURNOVER OR (KITS) ×3 IMPLANT
L-HOOK LAP DISP 36CM (ELECTROSURGICAL) ×3
LHOOK LAP DISP 36CM (ELECTROSURGICAL) ×1 IMPLANT
NEEDLE 22X1 1/2 (OR ONLY) (NEEDLE) ×3 IMPLANT
NS IRRIG 1000ML POUR BTL (IV SOLUTION) ×3 IMPLANT
PAD ARMBOARD 7.5X6 YLW CONV (MISCELLANEOUS) ×3 IMPLANT
PENCIL BUTTON HOLSTER BLD 10FT (ELECTRODE) ×3 IMPLANT
POUCH RETRIEVAL ECOSAC 10 (ENDOMECHANICALS) ×1 IMPLANT
POUCH RETRIEVAL ECOSAC 10MM (ENDOMECHANICALS) ×2
SCISSORS LAP 5X35 DISP (ENDOMECHANICALS) ×3 IMPLANT
SET CHOLANGIOGRAPH 5 50 .035 (SET/KITS/TRAYS/PACK) ×3 IMPLANT
SET IRRIG TUBING LAPAROSCOPIC (IRRIGATION / IRRIGATOR) ×3 IMPLANT
SLEEVE ENDOPATH XCEL 5M (ENDOMECHANICALS) ×6 IMPLANT
SPECIMEN JAR SMALL (MISCELLANEOUS) ×3 IMPLANT
SUT ETHILON 2 0 FS 18 (SUTURE) ×2 IMPLANT
SUT VIC AB 4-0 PS2 27 (SUTURE) ×3 IMPLANT
TOWEL OR 17X24 6PK STRL BLUE (TOWEL DISPOSABLE) ×3 IMPLANT
TOWEL OR 17X26 10 PK STRL BLUE (TOWEL DISPOSABLE) ×3 IMPLANT
TRAY LAPAROSCOPIC MC (CUSTOM PROCEDURE TRAY) ×3 IMPLANT
TROCAR XCEL BLUNT TIP 100MML (ENDOMECHANICALS) ×3 IMPLANT
TROCAR XCEL NON-BLD 11X100MML (ENDOMECHANICALS) ×2 IMPLANT
TROCAR XCEL NON-BLD 5MMX100MML (ENDOMECHANICALS) ×3 IMPLANT
TUBING INSUFFLATION (TUBING) ×3 IMPLANT

## 2016-05-02 NOTE — Anesthesia Preprocedure Evaluation (Signed)
Anesthesia Evaluation  Patient identified by MRN, date of birth, ID band Patient awake    Reviewed: Allergy & Precautions, NPO status , Patient's Chart, lab work & pertinent test results  History of Anesthesia Complications (+) PONV  Airway Mallampati: I  TM Distance: >3 FB Neck ROM: Full    Dental   Pulmonary former smoker,    Pulmonary exam normal        Cardiovascular hypertension, Normal cardiovascular exam     Neuro/Psych    GI/Hepatic   Endo/Other  diabetes, Type 2, Oral Hypoglycemic Agents  Renal/GU      Musculoskeletal   Abdominal   Peds  Hematology   Anesthesia Other Findings   Reproductive/Obstetrics                             Anesthesia Physical Anesthesia Plan  ASA: III  Anesthesia Plan: General   Post-op Pain Management:    Induction: Intravenous  Airway Management Planned: Oral ETT  Additional Equipment:   Intra-op Plan:   Post-operative Plan: Extubation in OR  Informed Consent: I have reviewed the patients History and Physical, chart, labs and discussed the procedure including the risks, benefits and alternatives for the proposed anesthesia with the patient or authorized representative who has indicated his/her understanding and acceptance.     Plan Discussed with: CRNA and Surgeon  Anesthesia Plan Comments:         Anesthesia Quick Evaluation

## 2016-05-02 NOTE — Progress Notes (Signed)
Subjective:    Admitted 11/20 with abdominal pain and choledocholithiasis. Her preoperative course was complicated by paroxysmal atrial fibrillation. Echocardiogram 11/22 EF 60-65% with moderate LAE  (38/1.9/39)   No known CAD    She underwent laparoscopic cholecystectomy this morning   C/o some abdominal pain, but no CP or SOB Objective:  Temp:  [97.3 F (36.3 C)-98.6 F (37 C)] 97.3 F (36.3 C) (11/25 1215) Pulse Rate:  [62] 62 (11/25 0618) Resp:  [18] 18 (11/25 0618) BP: (166-171)/(50-53) 171/50 (11/25 0618) SpO2:  [92 %-96 %] 93 % (11/25 1215) Weight change:     Physical Exam: General appearance: alert and mild discomfort  Neck:   no JVD, supple,  Lungs: clear to auscultation laterally Heart: regular rate and rhythm, S1, S2 normal, no murmur, click, rub or gallop Extremities: extremities normal, atraumatic, no cyanosis or edema Skin warm and dry Psych  Alert and joking   Lab Results: Results for orders placed or performed during the hospital encounter of 04/27/16 (from the past 48 hour(s))  Glucose, capillary     Status: Abnormal   Collection Time: 04/30/16  1:28 PM  Result Value Ref Range   Glucose-Capillary 213 (H) 65 - 99 mg/dL  Comprehensive metabolic panel     Status: Abnormal   Collection Time: 04/30/16  1:40 PM  Result Value Ref Range   Sodium 136 135 - 145 mmol/L   Potassium 3.8 3.5 - 5.1 mmol/L   Chloride 106 101 - 111 mmol/L   CO2 22 22 - 32 mmol/L   Glucose, Bld 198 (H) 65 - 99 mg/dL   BUN 17 6 - 20 mg/dL   Creatinine, Ser 0.90 0.44 - 1.00 mg/dL   Calcium 8.0 (L) 8.9 - 10.3 mg/dL   Total Protein 5.8 (L) 6.5 - 8.1 g/dL   Albumin 2.2 (L) 3.5 - 5.0 g/dL   AST 42 (H) 15 - 41 U/L   ALT 86 (H) 14 - 54 U/L   Alkaline Phosphatase 493 (H) 38 - 126 U/L   Total Bilirubin 1.4 (H) 0.3 - 1.2 mg/dL   GFR calc non Af Amer >60 >60 mL/min   GFR calc Af Amer >60 >60 mL/min    Comment: (NOTE) The eGFR has been calculated using the CKD EPI equation. This  calculation has not been validated in all clinical situations. eGFR's persistently <60 mL/min signify possible Chronic Kidney Disease.    Anion gap 8 5 - 15  Glucose, capillary     Status: Abnormal   Collection Time: 04/30/16  5:11 PM  Result Value Ref Range   Glucose-Capillary 249 (H) 65 - 99 mg/dL   Comment 1 Notify RN   Glucose, capillary     Status: Abnormal   Collection Time: 04/30/16 10:36 PM  Result Value Ref Range   Glucose-Capillary 238 (H) 65 - 99 mg/dL  CBC     Status: Abnormal   Collection Time: 05/01/16 12:28 AM  Result Value Ref Range   WBC 5.1 4.0 - 10.5 K/uL   RBC 3.46 (L) 3.87 - 5.11 MIL/uL   Hemoglobin 9.5 (L) 12.0 - 15.0 g/dL   HCT 28.6 (L) 36.0 - 46.0 %   MCV 82.7 78.0 - 100.0 fL   MCH 27.5 26.0 - 34.0 pg   MCHC 33.2 30.0 - 36.0 g/dL   RDW 13.6 11.5 - 15.5 %   Platelets 204 150 - 400 K/uL  Comprehensive metabolic panel     Status: Abnormal   Collection Time: 05/01/16  12:28 AM  Result Value Ref Range   Sodium 136 135 - 145 mmol/L   Potassium 3.8 3.5 - 5.1 mmol/L   Chloride 107 101 - 111 mmol/L   CO2 23 22 - 32 mmol/L   Glucose, Bld 228 (H) 65 - 99 mg/dL   BUN 14 6 - 20 mg/dL   Creatinine, Ser 0.87 0.44 - 1.00 mg/dL   Calcium 7.8 (L) 8.9 - 10.3 mg/dL   Total Protein 4.9 (L) 6.5 - 8.1 g/dL   Albumin 2.0 (L) 3.5 - 5.0 g/dL   AST 27 15 - 41 U/L   ALT 63 (H) 14 - 54 U/L   Alkaline Phosphatase 410 (H) 38 - 126 U/L   Total Bilirubin 0.8 0.3 - 1.2 mg/dL   GFR calc non Af Amer >60 >60 mL/min   GFR calc Af Amer >60 >60 mL/min    Comment: (NOTE) The eGFR has been calculated using the CKD EPI equation. This calculation has not been validated in all clinical situations. eGFR's persistently <60 mL/min signify possible Chronic Kidney Disease.    Anion gap 6 5 - 15  Lipase, blood     Status: Abnormal   Collection Time: 05/01/16 12:28 AM  Result Value Ref Range   Lipase 200 (H) 11 - 51 U/L  Glucose, capillary     Status: Abnormal   Collection Time: 05/01/16   8:03 AM  Result Value Ref Range   Glucose-Capillary 207 (H) 65 - 99 mg/dL  Glucose, capillary     Status: Abnormal   Collection Time: 05/01/16 12:50 PM  Result Value Ref Range   Glucose-Capillary 164 (H) 65 - 99 mg/dL  Glucose, capillary     Status: Abnormal   Collection Time: 05/01/16  5:41 PM  Result Value Ref Range   Glucose-Capillary 222 (H) 65 - 99 mg/dL  Glucose, capillary     Status: Abnormal   Collection Time: 05/01/16  9:09 PM  Result Value Ref Range   Glucose-Capillary 276 (H) 65 - 99 mg/dL  Lipase, blood     Status: Abnormal   Collection Time: 05/02/16  3:47 AM  Result Value Ref Range   Lipase 124 (H) 11 - 51 U/L  Comprehensive metabolic panel     Status: Abnormal   Collection Time: 05/02/16  3:47 AM  Result Value Ref Range   Sodium 138 135 - 145 mmol/L   Potassium 3.6 3.5 - 5.1 mmol/L   Chloride 107 101 - 111 mmol/L   CO2 23 22 - 32 mmol/L   Glucose, Bld 222 (H) 65 - 99 mg/dL   BUN 10 6 - 20 mg/dL   Creatinine, Ser 0.73 0.44 - 1.00 mg/dL   Calcium 8.3 (L) 8.9 - 10.3 mg/dL   Total Protein 5.1 (L) 6.5 - 8.1 g/dL   Albumin 2.1 (L) 3.5 - 5.0 g/dL   AST 23 15 - 41 U/L   ALT 47 14 - 54 U/L   Alkaline Phosphatase 367 (H) 38 - 126 U/L   Total Bilirubin 0.6 0.3 - 1.2 mg/dL   GFR calc non Af Amer >60 >60 mL/min   GFR calc Af Amer >60 >60 mL/min    Comment: (NOTE) The eGFR has been calculated using the CKD EPI equation. This calculation has not been validated in all clinical situations. eGFR's persistently <60 mL/min signify possible Chronic Kidney Disease.    Anion gap 8 5 - 15  CBC     Status: Abnormal   Collection Time: 05/02/16  3:47 AM  Result Value Ref Range   WBC 4.9 4.0 - 10.5 K/uL   RBC 3.50 (L) 3.87 - 5.11 MIL/uL   Hemoglobin 9.7 (L) 12.0 - 15.0 g/dL   HCT 28.8 (L) 36.0 - 46.0 %   MCV 82.3 78.0 - 100.0 fL   MCH 27.7 26.0 - 34.0 pg   MCHC 33.7 30.0 - 36.0 g/dL   RDW 13.3 11.5 - 15.5 %   Platelets 225 150 - 400 K/uL  Glucose, capillary     Status:  Abnormal   Collection Time: 05/02/16  7:39 AM  Result Value Ref Range   Glucose-Capillary 226 (H) 65 - 99 mg/dL  Glucose, capillary     Status: Abnormal   Collection Time: 05/02/16 11:34 AM  Result Value Ref Range   Glucose-Capillary 187 (H) 65 - 99 mg/dL    Imaging: Imaging results have been reviewed  Telemetry Personally reviewed  sinus rhythm with PACs  Assessment/Plan:  Paroxysmal atrial fibrillation  Choledocholithiasis status post laparoscopic cholecystectomy  Rhythm stable   Virl Axe 05/02/2016, 1:10 PM

## 2016-05-02 NOTE — Anesthesia Procedure Notes (Signed)
Procedure Name: Intubation Date/Time: 05/02/2016 9:15 AM Performed by: Fransisca KaufmannMEYER, Dhalia Zingaro E Pre-anesthesia Checklist: Patient identified, Emergency Drugs available, Suction available and Patient being monitored Patient Re-evaluated:Patient Re-evaluated prior to inductionOxygen Delivery Method: Circle System Utilized Preoxygenation: Pre-oxygenation with 100% oxygen Intubation Type: IV induction Ventilation: Mask ventilation without difficulty Laryngoscope Size: Miller and 2 Grade View: Grade I Tube type: Oral Tube size: 7.5 mm Number of attempts: 1 Airway Equipment and Method: Stylet and Oral airway Placement Confirmation: ETT inserted through vocal cords under direct vision,  positive ETCO2 and breath sounds checked- equal and bilateral Secured at: 22 cm Tube secured with: Tape Dental Injury: Teeth and Oropharynx as per pre-operative assessment

## 2016-05-02 NOTE — Transfer of Care (Signed)
Immediate Anesthesia Transfer of Care Note  Patient: Rachel Wong  Procedure(s) Performed: Procedure(s): LAPAROSCOPIC CHOLECYSTECTOMY WITH INTRAOPERATIVE CHOLANGIOGRAM (N/A)  Patient Location: PACU  Anesthesia Type:General  Level of Consciousness: awake, alert , oriented and sedated  Airway & Oxygen Therapy: Patient Spontanous Breathing and Patient connected to face mask oxygen  Post-op Assessment: Report given to RN, Post -op Vital signs reviewed and stable and Patient moving all extremities  Post vital signs: Reviewed and stable  Last Vitals:  Vitals:   05/02/16 0618 05/02/16 1115  BP: (!) 171/50   Pulse: 62   Resp: 18   Temp: 36.7 C 36.6 C    Last Pain:  Vitals:   05/02/16 0618  TempSrc: Oral  PainSc:       Patients Stated Pain Goal: 0 (04/27/16 1614)  Complications: No apparent anesthesia complications

## 2016-05-02 NOTE — Op Note (Signed)
04/27/2016 - 05/02/2016  10:59 AM  PATIENT:  Rachel Wong  75 y.o. female  PRE-OPERATIVE DIAGNOSIS:  biliary pancreatitis  POST-OPERATIVE DIAGNOSIS:  cholecystitis with empyema of the gallbladder  PROCEDURE:  Procedure(s): LAPAROSCOPIC CHOLECYSTECTOMY  SURGEON:  Violeta GelinasBurke Zyon Rosser, M.D.  ASSISTANTS: Manus RuddMatthew Tsuei, MD   ANESTHESIA:   local and general  EBL:  Total I/O In: 1200 [I.V.:1200] Out: 800 [Urine:800]  BLOOD ADMINISTERED:none  DRAINS: (1) Jackson-Pratt drain(s) with closed bulb suction in the RUQ   SPECIMEN:  Excision  DISPOSITION OF SPECIMEN:  PATHOLOGY  COUNTS:  YES  DICTATION: .Dragon Dictation Findings: Empyema of the gallbladder with a large stone cast filling the entirety of the lumen  Procedure in detail: Mrs. Inda CokeGertz was identified in the preop holding area. Informed consent was obtained. She received intravenous and buttocks. She was brought And general endotracheal anesthesia was administered by the anesthesia staff. Her abdomen was prepped and draped in a sterile fashion. We did time out procedure.The infraumbilical region was infiltrated with local. Infraumbilical incision was made. Subcutaneous tissues were dissected down revealing the anterior fascia. This was divided sharply along the midline. Peritoneal cavity was entered under direct vision without complication. A 0 Vicryl pursestring was placed around the fascial opening. Hassan trocar was inserted into the abdomen. The abdomen was insufflated with carbon dioxide in standard fashion. Under direct vision a 5 mm epigastric and 5 mm right lateral ports 2 were placed. Local was used at each port site. Laparoscopic exploration revealed a tensely distended gallbladder. The dome was retracted superior medially. The infundibulum was retracted inferior laterally. Dissection began laterally and progressed medially easily identifying 1 branch of the cystic artery and the cystic duct. The artery was dissected proximally  and distally, clipped twice proximally and once distally, and divided. Further dissection created a critical view around the cystic duct. The epigastric port was upsized to 11 mm to allow other instruments to be passed. The cystic duct was quite short, however,. The area was very inflamed. Decision was made not to do a cholangiogram. The gallbladder was very difficult to grab along the body and started falling apart. The. Fluid was evacuated. The cast of the gallbladder made up of a giant stone fell out. This was placed in an Endo Catch bag. The gallbladder was then taken off the liver and a dome down fashion. The posterior wall had some necrotic areas but we removed the majority of the gallbladder as we were able. Further dissection down by the infundibulum revealed a posterior branch of the cystic artery. This was clipped twice proximally and divided distally with cautery. Gallbladder was removed the rest of the way and placed in an Endo Catch bag. The bag with the stone in the bag with the gallbladder were removed together from the abdomen. Specimen was sent to pathology. Liver bed was then cauterized liberally to get good hemostasis and cauterize the small remnants of gallbladder wall there. Excellent hemostasis was ensured. Clips remain in good position on the cystic duct stump. A 19 [place] drain was placed in the right upper quadrant liver bed and exited through one of the right-sided port sites. It was secured with nylon. The abdomen was further irrigated and irrigation returned clear. Liver bed appeared dry. Ports removed under direct vision. Pneumoperitoneum was released. Informed local fascia was closed by tying the pursestring. All 3 remaining wounds were irrigated and closed with running 4 Vicryl subcuticular followed by Dermabond. All counts were correct. She tolerated the procedure well without apparent  complications and was taken recovery in stable condition. PATIENT DISPOSITION:  PACU -  hemodynamically stable.   Delay start of Pharmacological VTE agent (>24hrs) due to surgical blood loss or risk of bleeding:  no  Violeta GelinasBurke Jovaughn Wojtaszek, MD, MPH, FACS Pager: [phone]  11/25/201710:59 AM

## 2016-05-02 NOTE — Progress Notes (Signed)
Received back from PACU, IS brought to room, pt medicated for pain.

## 2016-05-02 NOTE — Anesthesia Postprocedure Evaluation (Signed)
Anesthesia Post Note  Patient: Rachel Wong  Procedure(s) Performed: Procedure(s) (LRB): LAPAROSCOPIC CHOLECYSTECTOMY WITH INTRAOPERATIVE CHOLANGIOGRAM (N/A)  Patient location during evaluation: PACU Anesthesia Type: General Level of consciousness: awake and alert Pain management: pain level controlled Vital Signs Assessment: post-procedure vital signs reviewed and stable Respiratory status: spontaneous breathing, nonlabored ventilation, respiratory function stable and patient connected to nasal cannula oxygen Cardiovascular status: blood pressure returned to baseline and stable Postop Assessment: no signs of nausea or vomiting Anesthetic complications: no    Last Vitals:  Vitals:   05/02/16 0618 05/02/16 1115  BP: (!) 171/50   Pulse: 62   Resp: 18   Temp: 36.7 C 36.6 C    Last Pain:  Vitals:   05/02/16 1200  TempSrc:   PainSc: Asleep                 Lynard Postlewait DAVID

## 2016-05-02 NOTE — Progress Notes (Signed)
3 Days Post-Op  Subjective: No abdominal pain  Objective: Vital signs in last 24 hours: Temp:  [98.1 F (36.7 C)-98.6 F (37 C)] 98.1 F (36.7 C) (11/25 0618) Pulse Rate:  [56-62] 62 (11/25 0618) Resp:  [18] 18 (11/25 0618) BP: (159-171)/(50-56) 171/50 (11/25 0618) SpO2:  [94 %-98 %] 94 % (11/25 0618) Last BM Date: 04/30/16  Intake/Output from previous day: 11/24 0701 - 11/25 0700 In: 1224.2 [I.V.:1224.2] Out: 1250 [Urine:1250] Intake/Output this shift: No intake/output data recorded.  General appearance: cooperative Head: Normocephalic, without obvious abnormality Resp: clear to auscultation bilaterally Cardio: regular rate and rhythm GI: soft, NT  Lab Results:   Recent Labs  05/01/16 0028 05/02/16 0347  WBC 5.1 4.9  HGB 9.5* 9.7*  HCT 28.6* 28.8*  PLT 204 225   BMET  Recent Labs  05/01/16 0028 05/02/16 0347  NA 136 138  K 3.8 3.6  CL 107 107  CO2 23 23  GLUCOSE 228* 222*  BUN 14 10  CREATININE 0.87 0.73  CALCIUM 7.8* 8.3*   PT/INR  Recent Labs  04/30/16 0320  LABPROT 16.1*  INR 1.28   ABG No results for input(s): PHART, HCO3 in the last 72 hours.  Invalid input(s): PCO2, PO2  Studies/Results: No results found.  Anti-infectives: Anti-infectives    Start     Dose/Rate Route Frequency Ordered Stop   04/27/16 1400  piperacillin-tazobactam (ZOSYN) IVPB 3.375 g     3.375 g 12.5 mL/hr over 240 Minutes Intravenous Every 8 hours 04/27/16 1017 04/30/16 2359   04/27/16 0615  piperacillin-tazobactam (ZOSYN) IVPB 3.375 g     3.375 g 100 mL/hr over 30 Minutes Intravenous  Once 04/27/16 0609 04/27/16 0700      Assessment/Plan: Gallstone pancreatitis Choledocholithiasis  - on admission lipase was found to be 6436, WBC 11.6, total bilirubin 1.9, alkaline phosphatase 946, ALT 180, AST 183 - CT showed choledocholithiasis with severe resultant intrahepatic and extrahepatic biliary dilatation - s/p ERCP  - lipase down to 124  New diagnosis  atrial fibrillation - currently in NSR, cleared by cardiology as low cardiovascular risk for surgery - 04/29/16 ECHO 60%-65%  HTN PVD HLD DM2 Diabetic ulcer of left great toe   Plan - to OR for lap chole/IOC this AM. Procedure, risks, and benefits again discussed along with the expected post-op course. She agrees. Her daughter has arranged [facility] for rehab after hospitalization.  LOS: 5 days    Rachel Wong 05/02/2016

## 2016-05-02 NOTE — Progress Notes (Addendum)
PROGRESS NOTE [facility]   [place]al[place] Wong   ION:[id number] DOB: 05-13-1941  DOA: 04/27/2016 PCP: Raynelle JanSPRY,HEATHER M., MD   Brief Narrative:  [age] yoF w/ a Hx of HTN,PVD,HLD, DM2, and a Diabetic ulcer of left great toe who [facility] with acute mid abdominal pain, nausea, and non-bloody vomiting. Patient was admitted for choledocholithiasis with obstruction, gallstones pancreatitis. GI was consulted ERCP was done with sphincterectomy and papillotomy. Surgery was consulted for cholecystectomy.  Subjective: Patient seen and examined, s/p lap chole. Doing well. Pain well controlled with medications. No appetite yet, No passing gas or micturition.  Assessment & Plan:  Choledocholithiasis with obstruction as evidenced by CT abdomen and pelvis - s/p lap chole day 0 - empyema of the gallbladder with large stones  Pain control as needed Continue antibiotics  Advance diet as tolerated  Surgery following  Encourage incentive spirometry  Acute pancreatitis secondary to gallstone - Resolved  Status post ERCP Lipase trending down  Atrial Fibrillation with RVR short duration converted to sinus rhythm after Cardizem - stable Cardio recommendations appreciated No anticoagulation needed- cardiology will follow as an outpatient okay to proceed with surgery.  Acute renal injury - resolved with fluids  Prerenal due to infection   HTN - trending up patient was off ACE inhibitor due to acute kidney injury. - Better control Continue enalapril  Continue metoprolol Monitor BP  Dm2 stable CBGs controlled Sliding-scale  Grade 2 diastolic dysfunction - newly appreciated No signs of fluid overload  DVT prophylaxis: Lovenox  Code Status: Full Family Communication: None bedside") Disposition Plan: Home when medically stable  Consultants:   [facility]  [facility]  Gen. surgery  Procedures: 11/22 TTE - EF 60-65 percent with no wall motion and modalities - grade 2 diastolic  dysfunction - moderate pulmonary hypertension at 42 mmHg  Antimicrobials:  Zosyn 11/19   Objective: Vitals:   05/02/16 0618 05/02/16 1115 05/02/16 1145 05/02/16 1215  BP: (!) 171/50     Pulse: 62     Resp: 18     Temp: 98.1 F (36.7 C) 97.8 F (36.6 C)  97.3 F (36.3 C)  TempSrc: Oral     SpO2: 94%  92% 93%  Weight:      Height:        Intake/Output Summary (Last 24 hours) at 05/02/16 1748 Last data filed at 05/02/16 1425  Gross per 24 hour  Intake           3287.5 ml  Output             1635 ml  Net           1652.5 ml   Filed Weights   04/27/16 0921 04/28/16 0813 04/29/16 0738  Weight: 80.2 kg (176 lb 12.9 oz) 83.3 kg (183 lb 10.3 oz) 84.1 kg (185 lb 6.5 oz)    Examination:  General exam: Lying down comfortable  Respiratory system: Clear to auscultation.  Cardiovascular system: S1 & S2 heard, RRR.  Gastrointestinal system: Abdomen soft mild distended, slight tenderness from surgery.  Extremities: No pedal edema.  Skin: No rashes, lesions or ulcers Psychiatry: Happy and relax    Data Reviewed: I have personally reviewed following labs and imaging studies  CBC:  Recent Labs Lab 04/27/16 0307 04/28/16 0211 04/30/16 0320 05/01/16 0028 05/02/16 0347  WBC 11.6* 12.4* 7.1 5.1 4.9  NEUTROABS 9.8*  --   --   --   --   HGB 11.8* 10.1* 9.5* 9.5* 9.7*  HCT  36.1 31.0* 29.1* 28.6* 28.8*  MCV 84.9 84.0 83.4 82.7 82.3  PLT 338 266 225 204 225   Basic Metabolic Panel:  Recent Labs Lab 04/28/16 0211 04/30/16 0320 04/30/16 1340 05/01/16 0028 05/02/16 0347  NA 141 137 136 136 138  K 4.3 3.6 3.8 3.8 3.6  CL 105 108 106 107 107  CO2 25 22 22 23 23   GLUCOSE 178* 169* 198* 228* 222*  BUN 28* 19 17 14 10   CREATININE 1.18* 0.89 0.90 0.87 0.73  CALCIUM 8.2* 7.7* 8.0* 7.8* 8.3*   GFR: Estimated Creatinine Clearance: 65.4 mL/min (by C-G formula based on SCr of 0.73 mg/dL). Liver Function Tests:  Recent Labs Lab 04/28/16 0211 04/30/16 0320 04/30/16 1340  05/01/16 0028 05/02/16 0347  AST 164* 42* 42* 27 23  ALT 172* 86* 86* 63* 47  ALKPHOS 674* 480* 493* 410* 367*  BILITOT 4.1* 1.4* 1.4* 0.8 0.6  PROT 5.5* 5.2* 5.8* 4.9* 5.1*  ALBUMIN 2.5* 2.0* 2.2* 2.0* 2.1*    Recent Labs Lab 04/27/16 0307 04/30/16 0320 05/01/16 0028 05/02/16 0347  LIPASE 6,436* 210* 200* 124*   No results for input(s): AMMONIA in the last 168 hours. Coagulation Profile:  Recent Labs Lab 04/28/16 0211 04/30/16 0320  INR 1.42 1.28   Cardiac Enzymes:  Recent Labs Lab 04/27/16 0710 04/27/16 1130  TROPONINI 0.03* 0.03*   BNP (last 3 results) No results for input(s): PROBNP in the last 8760 hours. HbA1C: No results for input(s): HGBA1C in the last 72 hours. CBG:  Recent Labs Lab 05/01/16 1250 05/01/16 1741 05/01/16 2109 05/02/16 0739 05/02/16 1134  GLUCAP 164* 222* 276* 226* 187*   Lipid Profile: No results for input(s): CHOL, HDL, LDLCALC, TRIG, CHOLHDL, LDLDIRECT in the last 72 hours. Thyroid Function Tests: No results for input(s): TSH, T4TOTAL, FREET4, T3FREE, THYROIDAB in the last 72 hours. Anemia Panel: No results for input(s): VITAMINB12, FOLATE, FERRITIN, TIBC, IRON, RETICCTPCT in the last 72 hours. Sepsis Labs:  Recent Labs Lab 04/27/16 0717 04/27/16 1130 04/27/16 1440  LATICACIDVEN 1.25 1.2 1.5    Recent Results (from the past 240 hour(s))  MRSA PCR Screening     Status: None   Collection Time: 04/27/16  9:52 AM  Result Value Ref Range Status   MRSA by PCR NEGATIVE NEGATIVE Final    Comment:        The GeneXpert MRSA Assay (FDA approved for NASAL specimens only), is one component of a comprehensive MRSA colonization surveillance program. It is not intended to diagnose MRSA infection nor to guide or monitor treatment for MRSA infections.   Urine culture     Status: Abnormal   Collection Time: 04/27/16 10:17 AM  Result Value Ref Range Status   Specimen Description URINE, CLEAN CATCH  Final   Special Requests  NONE  Final   Culture MULTIPLE SPECIES PRESENT, SUGGEST RECOLLECTION (A)  Final   Report Status 04/28/2016 FINAL  Final  Culture, blood (Routine X 2) w Reflex to ID Panel     Status: None   Collection Time: 04/27/16 10:48 AM  Result Value Ref Range Status   Specimen Description BLOOD LEFT ANTECUBITAL  Final   Special Requests BOTTLES DRAWN AEROBIC ONLY 5CC  Final   Culture NO GROWTH 5 DAYS  Final   Report Status 05/02/2016 FINAL  Final  Culture, blood (Routine X 2) w Reflex to ID Panel     Status: None   Collection Time: 04/27/16 10:58 AM  Result Value Ref Range Status  Specimen Description BLOOD BLOOD LEFT ARM  Final   Special Requests BOTTLES DRAWN AEROBIC ONLY 5CC  Final   Culture NO GROWTH 5 DAYS  Final   Report Status 05/02/2016 FINAL  Final     Radiology Studies: No results found.  Scheduled Meds: . cefTRIAXone (ROCEPHIN)  IV  2 g Intravenous Q24H  . enalapril  20 mg Oral Daily  . enoxaparin (LOVENOX) injection  40 mg Subcutaneous Q24H  . HYDROmorphone      . insulin aspart  0-9 Units Subcutaneous TID WC  . metoprolol succinate  100 mg Oral Daily  . ondansetron      . sodium chloride flush  3 mL Intravenous Q12H   Continuous Infusions: . sodium chloride 50 mL/hr at 05/01/16 2003     LOS: 5 days    Rachel DodrillEdwin Silva, MD [facility] Pager [phone]  If 7PM-7AM, please contact night-coverage [email or web address] Password [email or web address] 05/02/2016, 5:48 PM

## 2016-05-02 NOTE — Anesthesia Preprocedure Evaluation (Signed)
Anesthesia Evaluation Anesthesia Physical Anesthesia Plan Anesthesia Quick Evaluation  

## 2016-05-03 ENCOUNTER — Encounter (HOSPITAL_COMMUNITY): Payer: Self-pay | Admitting: General Surgery

## 2016-05-03 LAB — MAGNESIUM: MAGNESIUM: 1.5 mg/dL — AB (ref 1.7–2.4)

## 2016-05-03 LAB — CBC
HCT: 30 % — ABNORMAL LOW (ref 36.0–46.0)
Hemoglobin: 9.9 g/dL — ABNORMAL LOW (ref 12.0–15.0)
MCH: 27.7 pg (ref 26.0–34.0)
MCHC: 33 g/dL (ref 30.0–36.0)
MCV: 84 fL (ref 78.0–100.0)
PLATELETS: 231 10*3/uL (ref 150–400)
RBC: 3.57 MIL/uL — AB (ref 3.87–5.11)
RDW: 13.5 % (ref 11.5–15.5)
WBC: 8.8 10*3/uL (ref 4.0–10.5)

## 2016-05-03 LAB — GLUCOSE, CAPILLARY
GLUCOSE-CAPILLARY: 306 mg/dL — AB (ref 65–99)
GLUCOSE-CAPILLARY: 274 mg/dL — AB (ref 65–99)
Glucose-Capillary: 198 mg/dL — ABNORMAL HIGH (ref 65–99)
Glucose-Capillary: 354 mg/dL — ABNORMAL HIGH (ref 65–99)

## 2016-05-03 LAB — LIPASE, BLOOD: Lipase: 33 U/L (ref 11–51)

## 2016-05-03 MED ORDER — MAGNESIUM SULFATE 2 GM/50ML IV SOLN
2.0000 g | Freq: Once | INTRAVENOUS | Status: AC
  Administered 2016-05-03: 2 g via INTRAVENOUS
  Filled 2016-05-03: qty 50

## 2016-05-03 MED ORDER — DIPHENHYDRAMINE HCL 25 MG PO CAPS: 25.0000 mg | ORAL_CAPSULE | Freq: Three times a day (TID) | ORAL | Status: DC | PRN

## 2016-05-03 MED ORDER — DIPHENHYDRAMINE HCL 50 MG/ML IJ SOLN: 25.0000 mg | Freq: Three times a day (TID) | INTRAMUSCULAR | Status: DC | PRN

## 2016-05-03 NOTE — Progress Notes (Signed)
Physical Therapy Treatment Patient Details Name: Rachel ElksRalice Stailey MRN: [id number] DOB: 07-12-1940 Today's Date: 05/03/2016    History of Present Illness 75 y.o. female with a Past Medical History of significant for HTN, HLD who presents with abd pain. GI onboard. ERCP likely in next 1-2 days for choledocholithiasis followed by GB removal    PT Comments    Pt presents POD 1 following abdominal surgery. Pt moving slower this session as compared to previous session, but is able to perform gait safely with Rw. Pt's cadence improves as distance increases but fatigues quickly this session. Reviewed seated exercises in hep. Pt will benefit from SNF placement upon DC to improve strength and endurance before returning home.    Follow Up Recommendations  SNF     Equipment Recommendations  None recommended by PT    Recommendations for Other Services       Precautions / Restrictions Precautions Precautions: Fall Precaution Comments: wears custom shoes due to toe amuptations Restrictions Weight Bearing Restrictions: No    Mobility  Bed Mobility               General bed mobility comments: OOB when PT arrives  Transfers Overall transfer level: Needs assistance Equipment used: Rolling walker (2 wheeled) Transfers: Sit to/from Stand Sit to Stand: Min assist         General transfer comment: Min A from recliner with cues to scoot to edge to stand  Ambulation/Gait Ambulation/Gait assistance: Min assist Ambulation Distance (Feet): 50 Feet Assistive device: Rolling walker (2 wheeled) Gait Pattern/deviations: Step-through pattern Gait velocity: decreased Gait velocity interpretation: Below normal speed for age/gender General Gait Details: pt with slow cadence and requires cues for upright posture   Stairs            Wheelchair Mobility    Modified Rankin (Stroke Patients Only)       Balance                                    Cognition  Arousal/Alertness: Awake/alert Behavior During Therapy: WFL for tasks assessed/performed Overall Cognitive Status: Within Functional Limits for tasks assessed                      Exercises General Exercises - Lower Extremity Ankle Circles/Pumps: AROM;20 reps;Both;Seated Long Arc Quad: AROM;Both;10 reps;Seated Hip Flexion/Marching: AROM;Both;10 reps;Seated    General Comments        Pertinent Vitals/Pain Pain Assessment: Faces Faces Pain Scale: Hurts little more Pain Descriptors / Indicators: Moaning;Grimacing Pain Intervention(s): Monitored during session;Limited activity within patient's tolerance;Repositioned    Home Living                      Prior Function            PT Goals (current goals can now be found in the care plan section) Acute Rehab PT Goals Patient Stated Goal: to get better Progress towards PT goals: Progressing toward goals    Frequency    Min 3X/week      PT Plan Current plan remains appropriate    Co-evaluation             End of Session Equipment Utilized During Treatment: Gait belt Activity Tolerance: Patient tolerated treatment well Patient left: in chair;with call bell/phone within reach;with family/visitor present     Time: [id number] PT Time Calculation (min) (ACUTE ONLY): 20 min  Charges:  $  Gait Training: 8-22 mins                    G Codes:      Colin BroachSabra M. Anaja Monts PT, DPT  [phone]  05/03/2016, 12:48 PM

## 2016-05-03 NOTE — Progress Notes (Signed)
PROGRESS NOTE [facility]   HollidayRalice Gravely   ZHY:[id number] DOB: 12/25/1940  DOA: 04/27/2016 PCP: Raynelle JanSPRY,HEATHER M., MD   Brief Narrative:  [age] yoF w/ a Hx of HTN,PVD,HLD, DM2, and a Diabetic ulcer of left great toe who [facility] with acute mid abdominal pain, nausea, and non-bloody vomiting. Patient was admitted for choledocholithiasis with obstruction, gallstones pancreatitis. GI was consulted ERCP was done with sphincterectomy and papillotomy. Surgery was consulted for cholecystectomy.  Subjective: Doing well, no pain tolerating diet, ambulating with PT whom recommended SNF. Serosanguineous drainage in the JP drain.   Assessment & Plan:  Choledocholithiasis with obstruction as evidenced by CT abdomen and pelvis - s/p lap chole day 1 - empyema of the gallbladder with large stones, JP drain in place  Pain control as needed Continue IV antibiotics  Advance diet as tolerated  Surgery following  Encourage incentive spirometry  Acute pancreatitis secondary to gallstone - Resolved  Status post ERCP Lipase normal   Atrial Fibrillation with RVR short duration converted to sinus rhythm after Cardizem - stable Cardio recommendations appreciated No anticoagulation needed- cardiology will follow as an outpatient okay to proceed with surgery.  Acute renal injury - resolved with fluids  Prerenal due to infection   HTN - stable  Continue enalapril  Continue metoprolol Monitor BP  Dm2 stable CBGs controlled Sliding-scale  Grade 2 diastolic dysfunction - newly appreciated No signs of fluid overload  DVT prophylaxis: Lovenox  Code Status: Full Family Communication: None bedside Disposition Plan: SNF when cleared by surgery   Consultants:   [facility]  [facility]  Gen. 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  Antimicrobials:  Zosyn  11/19   Objective: Vitals:   05/02/16 2117 05/03/16 0453 05/03/16 0500 05/03/16 1250  BP: (!) 137/43 (!) 151/48 127/66 (!) 147/45  Pulse: 70 71 100 66  Resp: 18 18 20 19   Temp: 98.6 F (37 C) 97.3 F (36.3 C) 98.1 F (36.7 C) 99.1 F (37.3 C)  TempSrc: Oral Oral Oral Oral  SpO2: 95% 93% 100% 94%  Weight:      Height:        Intake/Output Summary (Last 24 hours) at 05/03/16 1419 Last data filed at 05/03/16 1400  Gross per 24 hour  Intake          1483.33 ml  Output              720 ml  Net           763.33 ml   Filed Weights   04/27/16 0921 04/28/16 0813 04/29/16 0738  Weight: 80.2 kg (176 lb 12.9 oz) 83.3 kg (183 lb 10.3 oz) 84.1 kg (185 lb 6.5 oz)    Examination:  General exam: OOB to chair, NAD  Respiratory system: Clear to auscultation.  Cardiovascular system: S1 & S2 heard, RRR.  Gastrointestinal system: Abdomen soft, NT Nd, JP drain in place intact serosanguineous fluid   Extremities: No pedal edema.  Psychiatry: Happy mood    Data Reviewed: I have personally reviewed following labs and imaging studies  CBC:  Recent Labs Lab 04/27/16 0307 04/28/16 0211 04/30/16 0320 05/01/16 0028 05/02/16 0347 05/03/16 0808  WBC 11.6* 12.4* 7.1 5.1 4.9 8.8  NEUTROABS 9.8*  --   --   --   --   --   HGB 11.8* 10.1* 9.5* 9.5* 9.7* 9.9*  HCT 36.1 31.0* 29.1* 28.6* 28.8* 30.0*  MCV 84.9 84.0 83.4 82.7 82.3 84.0  PLT 338 266 225 204 225 231   Basic Metabolic Panel:  Recent Labs Lab 04/30/16 0320 04/30/16 1340 05/01/16 0028 05/02/16 0347 05/02/16 1901 05/03/16 0808  NA 137 136 136 138 137  --   K 3.6 3.8 3.8 3.6 3.9  --   CL 108 106 107 107 106  --   CO2 22 22 23 23 22   --   GLUCOSE 169* 198* 228* 222* 260*  --   BUN 19 17 14 10 9   --   CREATININE 0.89 0.90 0.87 0.73 0.91  --   CALCIUM 7.7* 8.0* 7.8* 8.3* 7.9*  --   MG  --   --   --   --   --  1.5*   GFR: Estimated Creatinine Clearance: 57.5 mL/min (by C-G formula based on SCr of 0.91 mg/dL). Liver  Function Tests:  Recent Labs Lab 04/30/16 0320 04/30/16 1340 05/01/16 0028 05/02/16 0347 05/02/16 1901  AST 42* 42* 27 23 80*  ALT 86* 86* 63* 47 60*  ALKPHOS 480* 493* 410* 367* 335*  BILITOT 1.4* 1.4* 0.8 0.6 0.5  PROT 5.2* 5.8* 4.9* 5.1* 5.1*  ALBUMIN 2.0* 2.2* 2.0* 2.1* 2.1*    Recent Labs Lab 04/27/16 0307 04/30/16 0320 05/01/16 0028 05/02/16 0347 05/03/16 0808  LIPASE 6,436* 210* 200* 124* 33   No results for input(s): AMMONIA in the last 168 hours. Coagulation Profile:  Recent Labs Lab 04/28/16 0211 04/30/16 0320  INR 1.42 1.28   Cardiac Enzymes:  Recent Labs Lab 04/27/16 0710 04/27/16 1130  TROPONINI 0.03* 0.03*   BNP (last 3 results) No results for input(s): PROBNP in the last 8760 hours. HbA1C: No results for input(s): HGBA1C in the last 72 hours. CBG:  Recent Labs Lab 05/02/16 1134 05/02/16 1808 05/02/16 2120 05/03/16 0742 05/03/16 1157  GLUCAP 187* 243* 232* 198* 354*   Lipid Profile: No results for input(s): CHOL, HDL, LDLCALC, TRIG, CHOLHDL, LDLDIRECT in the last 72 hours. Thyroid Function Tests: No results for input(s): TSH, T4TOTAL, FREET4, T3FREE, THYROIDAB in the last 72 hours. Anemia Panel: No results for input(s): VITAMINB12, FOLATE, FERRITIN, TIBC, IRON, RETICCTPCT in the last 72 hours. Sepsis Labs:  Recent Labs Lab 04/27/16 0717 04/27/16 1130 04/27/16 1440  LATICACIDVEN 1.25 1.2 1.5    Recent Results (from the past 240 hour(s))  MRSA PCR Screening     Status: None   Collection Time: 04/27/16  9:52 AM  Result Value Ref Range Status   MRSA by PCR NEGATIVE NEGATIVE Final    Comment:        The GeneXpert MRSA Assay (FDA approved for NASAL specimens only), is one component of a comprehensive MRSA colonization surveillance program. It is not intended to diagnose MRSA infection nor to guide or monitor treatment for MRSA infections.   Urine culture     Status: Abnormal   Collection Time: 04/27/16 10:17 AM    Result Value Ref Range Status   Specimen Description URINE, CLEAN CATCH  Final   Special Requests NONE  Final   Culture MULTIPLE SPECIES PRESENT, SUGGEST RECOLLECTION (A)  Final   Report Status 04/28/2016 FINAL  Final  Culture, blood (Routine X 2) w Reflex to ID Panel     Status: None   Collection Time: 04/27/16 10:48 AM  Result Value Ref Range Status   Specimen Description BLOOD LEFT ANTECUBITAL  Final   Special Requests BOTTLES DRAWN AEROBIC ONLY  5CC  Final   Culture NO GROWTH 5 DAYS  Final   Report Status 05/02/2016 FINAL  Final  Culture, blood (Routine X 2) w Reflex to ID Panel     Status: None   Collection Time: 04/27/16 10:58 AM  Result Value Ref Range Status   Specimen Description BLOOD BLOOD LEFT ARM  Final   Special Requests BOTTLES DRAWN AEROBIC ONLY 5CC  Final   Culture NO GROWTH 5 DAYS  Final   Report Status 05/02/2016 FINAL  Final     Radiology Studies: No results found.  Scheduled Meds: . enalapril  20 mg Oral Daily  . enoxaparin (LOVENOX) injection  40 mg Subcutaneous Q24H  . insulin aspart  0-9 Units Subcutaneous TID WC  . metoprolol succinate  100 mg Oral Daily  . sodium chloride flush  3 mL Intravenous Q12H   Continuous Infusions:    LOS: 6 days    Latrelle Dodrill, MD [facility] Pager [phone]  If 7PM-7AM, please contact night-coverage [email or web address] Password [email or web address] 05/03/2016, 2:19 PM

## 2016-05-03 NOTE — Progress Notes (Signed)
Subjective:    Admitted 11/20 with abdominal pain and choledocholithiasis. Her preoperative course was complicated by paroxysmal atrial fibrillation. Echocardiogram 11/22 EF 60-65% with moderate LAE  (38/1.9/39)   No known CAD  She underwent laparoscopic cholecystectomy AM 11/25  C/o some abdominal pain, but no CP or SOB  Feeling much better today Objective:  Temp:  [97.3 F (36.3 C)-98.6 F (37 C)] 98.1 F (36.7 C) (11/26 0500) Pulse Rate:  [70-100] 100 (11/26 0500) Resp:  [18-20] 20 (11/26 0500) BP: (127-151)/(43-66) 127/66 (11/26 0500) SpO2:  [93 %-100 %] 100 % (11/26 0500) Weight change:   . cefTRIAXone (ROCEPHIN)  IV  2 g Intravenous Q24H  . enalapril  20 mg Oral Daily  . enoxaparin (LOVENOX) injection  40 mg Subcutaneous Q24H  . insulin aspart  0-9 Units Subcutaneous TID WC  . metoprolol succinate  100 mg Oral Daily  . sodium chloride flush  3 mL Intravenous Q12H      Physical Exam: General appearance: alert and mild discomfort  Neck:   no JVD, supple,  Lungs: clear to auscultation laterally Heart: regular rate and rhythm, S1, S2 normal, no murmur, click, rub or gallop Extremities: extremities normal, atraumatic, no cyanosis or edema Skin warm and dry Psych  Alert and joking   Lab Results: Results for orders placed or performed during the hospital encounter of 04/27/16 (from the past 48 hour(s))  Glucose, capillary     Status: Abnormal   Collection Time: 05/01/16 12:50 PM  Result Value Ref Range   Glucose-Capillary 164 (H) 65 - 99 mg/dL  Glucose, capillary     Status: Abnormal   Collection Time: 05/01/16  5:41 PM  Result Value Ref Range   Glucose-Capillary 222 (H) 65 - 99 mg/dL  Glucose, capillary     Status: Abnormal   Collection Time: 05/01/16  9:09 PM  Result Value Ref Range   Glucose-Capillary 276 (H) 65 - 99 mg/dL  Lipase, blood     Status: Abnormal   Collection Time: 05/02/16  3:47 AM  Result Value Ref Range   Lipase 124 (H) 11 - 51 U/L    Comprehensive metabolic panel     Status: Abnormal   Collection Time: 05/02/16  3:47 AM  Result Value Ref Range   Sodium 138 135 - 145 mmol/L   Potassium 3.6 3.5 - 5.1 mmol/L   Chloride 107 101 - 111 mmol/L   CO2 23 22 - 32 mmol/L   Glucose, Bld 222 (H) 65 - 99 mg/dL   BUN 10 6 - 20 mg/dL   Creatinine, Ser 0.73 0.44 - 1.00 mg/dL   Calcium 8.3 (L) 8.9 - 10.3 mg/dL   Total Protein 5.1 (L) 6.5 - 8.1 g/dL   Albumin 2.1 (L) 3.5 - 5.0 g/dL   AST 23 15 - 41 U/L   ALT 47 14 - 54 U/L   Alkaline Phosphatase 367 (H) 38 - 126 U/L   Total Bilirubin 0.6 0.3 - 1.2 mg/dL   GFR calc non Af Amer >60 >60 mL/min   GFR calc Af Amer >60 >60 mL/min    Comment: (NOTE) The eGFR has been calculated using the CKD EPI equation. This calculation has not been validated in all clinical situations. eGFR's persistently <60 mL/min signify possible Chronic Kidney Disease.    Anion gap 8 5 - 15  CBC     Status: Abnormal   Collection Time: 05/02/16  3:47 AM  Result Value Ref Range   WBC 4.9 4.0 -  10.5 K/uL   RBC 3.50 (L) 3.87 - 5.11 MIL/uL   Hemoglobin 9.7 (L) 12.0 - 15.0 g/dL   HCT 28.8 (L) 36.0 - 46.0 %   MCV 82.3 78.0 - 100.0 fL   MCH 27.7 26.0 - 34.0 pg   MCHC 33.7 30.0 - 36.0 g/dL   RDW 13.3 11.5 - 15.5 %   Platelets 225 150 - 400 K/uL  Glucose, capillary     Status: Abnormal   Collection Time: 05/02/16  7:39 AM  Result Value Ref Range   Glucose-Capillary 226 (H) 65 - 99 mg/dL  Glucose, capillary     Status: Abnormal   Collection Time: 05/02/16 11:34 AM  Result Value Ref Range   Glucose-Capillary 187 (H) 65 - 99 mg/dL  Glucose, capillary     Status: Abnormal   Collection Time: 05/02/16  6:08 PM  Result Value Ref Range   Glucose-Capillary 243 (H) 65 - 99 mg/dL  Comprehensive metabolic panel     Status: Abnormal   Collection Time: 05/02/16  7:01 PM  Result Value Ref Range   Sodium 137 135 - 145 mmol/L   Potassium 3.9 3.5 - 5.1 mmol/L   Chloride 106 101 - 111 mmol/L   CO2 22 22 - 32 mmol/L    Glucose, Bld 260 (H) 65 - 99 mg/dL   BUN 9 6 - 20 mg/dL   Creatinine, Ser 0.91 0.44 - 1.00 mg/dL   Calcium 7.9 (L) 8.9 - 10.3 mg/dL   Total Protein 5.1 (L) 6.5 - 8.1 g/dL   Albumin 2.1 (L) 3.5 - 5.0 g/dL   AST 80 (H) 15 - 41 U/L   ALT 60 (H) 14 - 54 U/L   Alkaline Phosphatase 335 (H) 38 - 126 U/L   Total Bilirubin 0.5 0.3 - 1.2 mg/dL   GFR calc non Af Amer 60 (L) >60 mL/min   GFR calc Af Amer >60 >60 mL/min    Comment: (NOTE) The eGFR has been calculated using the CKD EPI equation. This calculation has not been validated in all clinical situations. eGFR's persistently <60 mL/min signify possible Chronic Kidney Disease.    Anion gap 9 5 - 15  Glucose, capillary     Status: Abnormal   Collection Time: 05/02/16  9:20 PM  Result Value Ref Range   Glucose-Capillary 232 (H) 65 - 99 mg/dL  Glucose, capillary     Status: Abnormal   Collection Time: 05/03/16  7:42 AM  Result Value Ref Range   Glucose-Capillary 198 (H) 65 - 99 mg/dL   Comment 1 Notify RN   CBC     Status: Abnormal   Collection Time: 05/03/16  8:08 AM  Result Value Ref Range   WBC 8.8 4.0 - 10.5 K/uL   RBC 3.57 (L) 3.87 - 5.11 MIL/uL   Hemoglobin 9.9 (L) 12.0 - 15.0 g/dL   HCT 30.0 (L) 36.0 - 46.0 %   MCV 84.0 78.0 - 100.0 fL   MCH 27.7 26.0 - 34.0 pg   MCHC 33.0 30.0 - 36.0 g/dL   RDW 13.5 11.5 - 15.5 %   Platelets 231 150 - 400 K/uL  Magnesium     Status: Abnormal   Collection Time: 05/03/16  8:08 AM  Result Value Ref Range   Magnesium 1.5 (L) 1.7 - 2.4 mg/dL  Lipase, blood     Status: None   Collection Time: 05/03/16  8:08 AM  Result Value Ref Range   Lipase 33 11 - 51 U/L  Glucose, capillary  Status: Abnormal   Collection Time: 05/03/16 11:57 AM  Result Value Ref Range   Glucose-Capillary 354 (H) 65 - 99 mg/dL   Comment 1 Notify RN     Imaging: Imaging results have been reviewed  Telemetry Personally reviewed  sinus rhythm with PACs  Assessment/Plan:  Paroxysmal atrial  fibrillation  Hypertension   Choledocholithiasis status post laparoscopic cholecystectomy  Rhythm stable   Continue current meds  BP reasonably controlled    Virl Axe 05/03/2016, 12:03 PM

## 2016-05-03 NOTE — Progress Notes (Signed)
[facility] Progress Note  1 Day Post-Op  Subjective: Slept well. No complaints this AM. Pain controlled. Tolerating clears without nausea/vomiting. Sat up on the edge of the bed for 1 hour this AM. Urinating without hesitancy. +flatus.   Objective: Vital signs in last 24 hours: Temp:  [97.3 F (36.3 C)-98.6 F (37 C)] 98.1 F (36.7 C) (11/26 0500) Pulse Rate:  [70-100] 100 (11/26 0500) Resp:  [18-20] 20 (11/26 0500) BP: (127-151)/(43-66) 127/66 (11/26 0500) SpO2:  [92 %-100 %] 100 % (11/26 0500) Last BM Date: 04/30/16  Intake/Output from previous day: 11/25 0701 - 11/26 0700 In: 2063.3 [P.O.:100; I.V.:1913.3; IV Piggyback:50] Out: 7622 [Urine:800; Drains:400; Blood:15] Intake/Output this shift: No intake/output data recorded.  PE: Gen:  Alert, NAD, pleasant Pulm:  CTA, no W/R/R Abd: Soft, appropriately tender, ND, +BS, no HSM, incisions C/D/I with mild surrounding ecchymosis, drain with ~ 15 cc sero-sanguinous drainage  Lab Results:   Recent Labs  05/01/16 0028 05/02/16 0347  WBC 5.1 4.9  HGB 9.5* 9.7*  HCT 28.6* 28.8*  PLT 204 225   BMET  Recent Labs  05/02/16 0347 05/02/16 1901  NA 138 137  K 3.6 3.9  CL 107 106  CO2 23 22  GLUCOSE 222* 260*  BUN 10 9  CREATININE 0.73 0.91  CALCIUM 8.3* 7.9*   PT/INR No results for input(s): LABPROT, INR in the last 72 hours. CMP     Component Value Date/Time   NA 137 05/02/2016 1901   K 3.9 05/02/2016 1901   CL 106 05/02/2016 1901   CO2 22 05/02/2016 1901   GLUCOSE 260 (H) 05/02/2016 1901   BUN 9 05/02/2016 1901   CREATININE 0.91 05/02/2016 1901   CALCIUM 7.9 (L) 05/02/2016 1901   PROT 5.1 (L) 05/02/2016 1901   ALBUMIN 2.1 (L) 05/02/2016 1901   AST 80 (H) 05/02/2016 1901   ALT 60 (H) 05/02/2016 1901   ALKPHOS 335 (H) 05/02/2016 1901   BILITOT 0.5 05/02/2016 1901   GFRNONAA 60 (L) 05/02/2016 1901   GFRAA >60 05/02/2016 1901   Lipase     Component Value Date/Time   LIPASE 124 (H)  05/02/2016 0347       Studies/Results: No results found.  Anti-infectives: Anti-infectives    Start     Dose/Rate Route Frequency Ordered Stop   05/02/16 1400  cefTRIAXone (ROCEPHIN) 2 g in dextrose 5 % 50 mL IVPB     2 g 100 mL/hr over 30 Minutes Intravenous Every 24 hours 05/02/16 1252 05/04/16 1359   04/27/16 1400  piperacillin-tazobactam (ZOSYN) IVPB 3.375 g     3.375 g 12.5 mL/hr over 240 Minutes Intravenous Every 8 hours 04/27/16 1017 04/30/16 2359   04/27/16 0615  piperacillin-tazobactam (ZOSYN) IVPB 3.375 g     3.375 g 100 mL/hr over 30 Minutes Intravenous  Once 04/27/16 0609 04/27/16 0700     Assessment/Plan Gallstone pancreatitis Choledocholithiasis  Cholecystitis with empyema of the gallbladder - on admission lipase 6436, WBC 11.6, t.bili 1.9, alk phos 946, ALT 180, AST 183 - CT showed choledocholithiasis with severe resultant intrahepatic and extrahepatic biliary dilatation - status post ERCP 04/29/16 - Lipase WNL  POD#1 S/P LAPAROSCOPIC CHOLECYSTECTOMY  -Cast of GB made up of a giant stone, empyema present - abdominal irrigation performed and 33F Blake drain placed - transition to PO pain control - Drain: 400 cc/24h - Cont IV Abx - PT/OT - IS/pulm toilet  New diagnosis atrial fibrillation - currently in NSR, cleared by cardiology as low cardiovascular  risk for surgery - 04/29/16 ECHO 60%-65%   HTN PVD HLD DM2 Diabetic ulcer of left great toe   FEN: full liquids, advance diet as tolerated ID: Rocephin 11/25 >> VTE:  Dispo: Cont IV abx, PO pain control, advance diet, PT/OT consult Likely SNF at discharge         LOS: 6 days    Jill Alexanders , [facility] 05/03/2016, 7:59 AM Pager: [phone] Consults: [phone] Mon-Fri 7:00 am-4:30 pm Sat-Sun 7:00 am-11:30 am

## 2016-05-04 DIAGNOSIS — K859 Acute pancreatitis without necrosis or infection, unspecified: Secondary | ICD-10-CM

## 2016-05-04 DIAGNOSIS — E119 Type 2 diabetes mellitus without complications: Secondary | ICD-10-CM

## 2016-05-04 DIAGNOSIS — E118 Type 2 diabetes mellitus with unspecified complications: Secondary | ICD-10-CM

## 2016-05-04 DIAGNOSIS — E1165 Type 2 diabetes mellitus with hyperglycemia: Secondary | ICD-10-CM

## 2016-05-04 LAB — COMPREHENSIVE METABOLIC PANEL
ALBUMIN: 2.1 g/dL — AB (ref 3.5–5.0)
ALK PHOS: 269 U/L — AB (ref 38–126)
ALT: 37 U/L (ref 14–54)
AST: 31 U/L (ref 15–41)
Anion gap: 6 (ref 5–15)
BILIRUBIN TOTAL: 0.7 mg/dL (ref 0.3–1.2)
BUN: 10 mg/dL (ref 6–20)
CALCIUM: 8.3 mg/dL — AB (ref 8.9–10.3)
CO2: 26 mmol/L (ref 22–32)
CREATININE: 0.89 mg/dL (ref 0.44–1.00)
Chloride: 102 mmol/L (ref 101–111)
GFR calc Af Amer: >60 mL/min (ref >60)
GFR calc non Af Amer: >60 mL/min (ref >60)
GLUCOSE: 305 mg/dL — AB (ref 65–99)
Potassium: 3.8 mmol/L (ref 3.5–5.1)
Sodium: 134 mmol/L — ABNORMAL LOW (ref 135–145)
TOTAL PROTEIN: 5.2 g/dL — AB (ref 6.5–8.1)

## 2016-05-04 LAB — GLUCOSE, CAPILLARY
GLUCOSE-CAPILLARY: 375 mg/dL — AB (ref 65–99)
Glucose-Capillary: 280 mg/dL — ABNORMAL HIGH (ref 65–99)

## 2016-05-04 LAB — MAGNESIUM: Magnesium: 1.8 mg/dL (ref 1.7–2.4)

## 2016-05-04 MED ORDER — INSULIN ASPART 100 UNIT/ML ~~LOC~~ SOLN: 5.0000 [IU] | Freq: Three times a day (TID) | SUBCUTANEOUS | 0 refills | Status: DC

## 2016-05-04 MED ORDER — INSULIN GLARGINE 100 UNIT/ML ~~LOC~~ SOLN: 8.0000 [IU] | Freq: Every day | SUBCUTANEOUS | 0 refills | Status: DC

## 2016-05-04 MED ORDER — METFORMIN HCL ER 500 MG PO TB24: 500.0000 mg | ORAL_TABLET | Freq: Two times a day (BID) | ORAL | Status: DC

## 2016-05-04 MED ORDER — HYDROCODONE-ACETAMINOPHEN 5-325 MG PO TABS: 1.0000 | ORAL_TABLET | Freq: Four times a day (QID) | ORAL | 0 refills | Status: DC | PRN

## 2016-05-04 MED ORDER — INSULIN ASPART 100 UNIT/ML ~~LOC~~ SOLN: 5.0000 [IU] | Freq: Three times a day (TID) | SUBCUTANEOUS | Status: DC

## 2016-05-04 MED ORDER — INSULIN ASPART 100 UNIT/ML ~~LOC~~ SOLN: 3.0000 [IU] | Freq: Three times a day (TID) | SUBCUTANEOUS | 0 refills | Status: DC

## 2016-05-04 MED ORDER — DOXYCYCLINE HYCLATE 100 MG PO CAPS: 100.0000 mg | ORAL_CAPSULE | Freq: Two times a day (BID) | ORAL | 0 refills | Status: AC

## 2016-05-04 NOTE — NC FL2 (Signed)
[place] MEDICAID FL2 LEVEL OF CARE SCREENING TOOL     IDENTIFICATION  Patient Name: Rachel Wong Birthdate: 1940/11/11 Sex: female Admission Date (Current Location): 04/27/2016  [facility] [place]   Facility and Address:  [facility] [place], [address], [place], [place] [address]      Provider Number: [id number]  Attending Physician Name and Address:  Cleora Fleetlanford L Johnson, MD  Relative Name and Phone Number:       Current Level of Care: Hospital Recommended Level of Care: Skilled Nursing Facility Prior Approval Number:    Date Approved/Denied:   PASRR Number:   [id number]   Discharge Plan: SNF    Current Diagnoses: Patient Active Problem List   Diagnosis Date Noted  . Choledocholithiasis   . Atrial fibrillation with rapid ventricular response (HCC)   . Pancreatitis due to common bile duct stone   . Uncontrolled type 2 diabetes mellitus with complication (HCC)   . Choledocholithiasis with obstruction 04/27/2016  . Atrial fibrillation (HCC) 04/27/2016  . Palpitations 03/16/2013  . HTN (hypertension) 03/16/2013  . DM2 (diabetes mellitus, type 2) (HCC) 03/16/2013  . Hyperlipidemia 03/16/2013    Orientation RESPIRATION BLADDER Height & Weight     Self, Time, Situation, Place  Normal Continent Weight: 84.1 kg (185 lb 6.5 oz) Height:  5\' 6"  (167.6 cm)  BEHAVIORAL SYMPTOMS/MOOD NEUROLOGICAL BOWEL NUTRITION STATUS      Continent Diet (Please see DC summary)  AMBULATORY STATUS COMMUNICATION OF NEEDS Skin   Extensive Assist Verbally Surgical wounds (Closed incision on abdomen)                       Personal Care Assistance Level of Assistance  Bathing, Feeding, Dressing Bathing Assistance: Limited assistance Feeding assistance: Independent Dressing Assistance: Limited assistance     Functional Limitations Info             SPECIAL CARE FACTORS FREQUENCY  PT (By licensed PT)     PT Frequency: 5x/week               Contractures      Additional Factors Info  Code Status, Allergies, Insulin Sliding Scale Code Status Info: Full Allergies Info: NKA   Insulin Sliding Scale Info: 3x/day       Current Medications (05/04/2016):  This is the current hospital active medication list Current Facility-Administered Medications  Medication Dose Route Frequency Provider Last Rate Last Dose  . acetaminophen (TYLENOL) tablet 650 mg  650 mg Oral Q6H PRN Marcos EkeSara E Wertman, PA-C       Or  . acetaminophen (TYLENOL) suppository 650 mg  650 mg Rectal Q6H PRN Marcos EkeSara E Wertman, PA-C      . bisacodyl (DULCOLAX) suppository 10 mg  10 mg Rectal Daily PRN Marcos EkeSara E Wertman, PA-C      . diphenhydrAMINE (BENADRYL) capsule 25 mg  25 mg Oral Q8H PRN Lenox PondsEdwin Silva Zapata, MD       Or  . diphenhydrAMINE (BENADRYL) injection 25 mg  25 mg Intramuscular Q8H PRN Lenox PondsEdwin Silva Zapata, MD      . enalapril (VASOTEC) tablet 20 mg  20 mg Oral Daily Lenox PondsEdwin Silva Zapata, MD   20 mg at 05/03/16 [id number]  . enoxaparin (LOVENOX) injection 40 mg  40 mg Subcutaneous Q24H Graylin ShiverSalem F Ganem, MD   40 mg at 05/03/16 0929  . hydrALAZINE (APRESOLINE) injection 10 mg  10 mg Intravenous Q8H PRN Haydee SalterPhillip M Hobbs, MD      .  HYDROcodone-acetaminophen (NORCO/VICODIN) 5-325 MG per tablet 1-2 tablet  1-2 tablet Oral Q4H PRN Marcos EkeSara E Wertman, PA-C   2 tablet at 05/04/16 0253  . insulin aspart (novoLOG) injection 0-9 Units  0-9 Units Subcutaneous TID WC Marcos EkeSara E Wertman, PA-C   5 Units at 05/04/16 0820  . magnesium citrate solution 1 Bottle  1 Bottle Oral Once PRN Marcos EkeSara E Wertman, PA-C      . metoprolol succinate (TOPROL-XL) 24 hr tablet 100 mg  100 mg Oral Daily Lonia BloodJeffrey T McClung, MD   100 mg at 05/03/16 0928  . morphine 2 MG/ML injection 1 mg  1 mg Intravenous Q4H PRN Lorretta HarpXilin Niu, MD   1 mg at 05/03/16 0640  . ondansetron (ZOFRAN) tablet 4 mg  4 mg Oral Q6H PRN Marcos EkeSara E Wertman, PA-C       Or  . ondansetron [facility](ZOFRAN) injection 4 mg  4 mg Intravenous Q6H PRN Marcos EkeSara E Wertman, PA-C    4 mg at 04/27/16 1629  . senna-docusate (Senokot-S) tablet 1 tablet  1 tablet Oral QHS PRN Marcos EkeSara E Wertman, PA-C      . sodium chloride flush (NS) 0.9 % injection 3 mL  3 mL Intravenous Q12H Marcos EkeSara E Wertman, PA-C   3 mL at 05/04/16 0259  . traZODone (DESYREL) tablet 25 mg  25 mg Oral QHS PRN Marcos EkeSara E Wertman, PA-C         Discharge Medications: Please see discharge summary for a list of discharge medications.  Relevant Imaging Results:  Relevant Lab Results:   Additional Information SSN: [id number]  Graviel Payeur S [place], [place]

## 2016-05-04 NOTE — Care Management Important Message (Signed)
Important Message  Patient Details  Name: Rachel Wong MRN: [id number] Date of Birth: 02-Jul-1940   Medicare Important Message Given:  Yes    Kyla BalzarineShealy, Merrik Puebla Abena 05/04/2016, 12:11 PM

## 2016-05-04 NOTE — Clinical Social Work Note (Signed)
Clinical Social Work Assessment  Patient Details  Name: Rachel Wong MRN: [id number] Date of Birth: [birth year]  Date of referral:  05/04/16               Reason for consult:  Facility Placement                Permission sought to share information with:  Facility Medical sales representativeContact Representative, Family Supports Permission granted to share information::  Yes, Verbal Permission Granted  Name::     IT consultantLisa  Agency::  SNFs  Relationship::  Daughter  Contact Information:     [phone]   Housing/Transportation Living arrangements for the past 2 months:  Single Family Home Source of Information:  Patient Patient Interpreter Needed:  None Criminal Activity/Legal Involvement Pertinent to Current Situation/Hospitalization:  No - Comment as needed Significant Relationships:  Adult Children Lives with:  Self Do you feel safe going back to the place where you live?  No Need for family participation in patient care:  Yes (Comment)  Care giving concerns:  CSW received consult for possible SNF placement at time of discharge. CSW spoke with patient regarding PT recommendation of SNF placement at time of discharge. Patient reported that she lives at home alone and given patient's current physical needs and fall risk she will need snf. Patient expressed understanding of PT recommendation and is agreeable to SNF placement at time of discharge. CSW to continue to follow and assist with discharge planning needs.   Social Worker assessment / plan:  CSW spoke with patient [facility].  Employment status:  Retired Database administratornsurance information:  Managed Medicare PT Recommendations:  Skilled Nursing Facility Information / Referral to community resources:  Skilled Nursing Facility  Patient/Family's Response to care:  Patient recognizes need for rehab before returning home and [place] Patient reported preference for [facility]. She states her  daughter has found her a bed there since she used to work there. CSW will follow up with [facility] to confirm. Patient reports that she knows she has to work hard to get better.   Patient/Family's Understanding of and Emotional Response to Diagnosis, Current Treatment, and Prognosis:  Patient/family is realistic regarding therapy needs and expressed being hopeful for SNF placement. Patient expressed understanding of CSW role and discharge process. No questions/concerns about plan or treatment.    Emotional Assessment Appearance:  Appears stated age Attitude/Demeanor/Rapport:  Other (Appropriate) Affect (typically observed):  Accepting, Appropriate, Pleasant Orientation:  Oriented to Self, Oriented to Place, Oriented to  Time, Oriented to Situation Alcohol / Substance use:  Not Applicable Psych involvement (Current and /or in the community):  No (Comment)  Discharge Needs  Concerns to be addressed:  Care Coordination Readmission within the last 30 days:  No Current discharge risk:  None Barriers to Discharge:  No Barriers Identified   Mearl Latinadia S Yeison Sippel, LCSWA 05/04/2016, 10:49 AM

## 2016-05-04 NOTE — Progress Notes (Signed)
[facility] Progress Note  2 Days Post-Op  Subjective: Patient states "I feel so good today!". Abdominal pain controlled with PO meds. Mild pain/pulling at drain site. Ambulated well with PT yesterday. Tolerated full liquids yesterday - now advanced to carb modified diet. +flatus. Denies green/brown fluid in her drain. Denies nausea/vomiting.  Objective: Vital signs in last 24 hours: Temp:  [98.2 F (36.8 C)-99.1 F (37.3 C)] 98.2 F (36.8 C) (11/27 0654) Pulse Rate:  [66-74] 71 (11/27 0654) Resp:  [18-19] 18 (11/27 0654) BP: (138-158)/(45-56) 158/54 (11/27 0654) SpO2:  [93 %-95 %] 93 % (11/27 0654) Last BM Date: 04/29/16  Intake/Output from previous day: 11/26 0701 - 11/27 0700 In: 1240 [P.O.:1240] Out: 1230 [Urine:900; Drains:330] Intake/Output this shift: Total I/O In: -  Out: 35 [Drains:35]  PE: Gen:  Alert, NAD, pleasant Abd: Soft, NT/ND, +BS, incisions C/D/I w/ mild surrounding ecchymosis, drain with minimal serous drainage Ext:  No erythema or tenderness   Lab Results:   Recent Labs  05/02/16 0347 05/03/16 0808  WBC 4.9 8.8  HGB 9.7* 9.9*  HCT 28.8* 30.0*  PLT 225 231   BMET  Recent Labs  05/02/16 0347 05/02/16 1901  NA 138 137  K 3.6 3.9  CL 107 106  CO2 23 22  GLUCOSE 222* 260*  BUN 10 9  CREATININE 0.73 0.91  CALCIUM 8.3* 7.9*   PT/INR No results for input(s): LABPROT, INR in the last 72 hours. CMP     Component Value Date/Time   NA 137 05/02/2016 1901   K 3.9 05/02/2016 1901   CL 106 05/02/2016 1901   CO2 22 05/02/2016 1901   GLUCOSE 260 (H) 05/02/2016 1901   BUN 9 05/02/2016 1901   CREATININE 0.91 05/02/2016 1901   CALCIUM 7.9 (L) 05/02/2016 1901   PROT 5.1 (L) 05/02/2016 1901   ALBUMIN 2.1 (L) 05/02/2016 1901   AST 80 (H) 05/02/2016 1901   ALT 60 (H) 05/02/2016 1901   ALKPHOS 335 (H) 05/02/2016 1901   BILITOT 0.5 05/02/2016 1901   GFRNONAA 60 (L) 05/02/2016 1901   GFRAA >60 05/02/2016 1901   Lipase      Component Value Date/Time   LIPASE 33 05/03/2016 0808       Studies/Results: No results found.  Anti-infectives: Anti-infectives    Start     Dose/Rate Route Frequency Ordered Stop   05/02/16 1400  cefTRIAXone (ROCEPHIN) 2 g in dextrose 5 % 50 mL IVPB     2 g 100 mL/hr over 30 Minutes Intravenous Every 24 hours 05/02/16 1252 05/03/16 1253   04/27/16 1400  piperacillin-tazobactam (ZOSYN) IVPB 3.375 g     3.375 g 12.5 mL/hr over 240 Minutes Intravenous Every 8 hours 04/27/16 1017 04/30/16 2359   04/27/16 0615  piperacillin-tazobactam (ZOSYN) IVPB 3.375 g     3.375 g 100 mL/hr over 30 Minutes Intravenous  Once 04/27/16 0609 04/27/16 0700      Assessment/Plan Gallstone pancreatitis Choledocholithiasis  Cholecystitis with empyema of the gallbladder - on admission lipase 6436, WBC 11.6, t.bili 1.9, alk phos 946, ALT 180, AST 183 - CT showed choledocholithiasis with severe resultant intrahepatic and extrahepatic biliary dilatation - status post ERCP 04/29/16 - Lipase WNL  POD#1 S/P LAPAROSCOPIC CHOLECYSTECTOMY  -Cast of GB made up of a giant stone, empyema present - abdominal irrigation performed and 84F Blake drain placed - Drain:~400 cc/24h, serious, non-bilious - pain controlled, tolerating diet - CMET pending - will require 5 total days of Abx - transition to PO's  at discharge (cipro/flagyl or Augmentin) - PT/OT  New diagnosis atrial fibrillation - currently in NSR, cleared by cardiology as low cardiovascular risk for surgery - 04/29/16 ECHO 60%-65%   HTN PVD HLD DM2 Diabetic ulcer of left great toe   FEN: carb modified ID: Rocephin 11/25 >>  VTE: lovenox, SCDs  Dispo: stable for discharge to SNF from a surgical standpoint. Drain may be removed prior to discharge. Patient should be transitioned to PO abx to complete at 5 day course. She will follow-up in our office in 2-3 weeks. Dietary information provided to patient.   LOS: 7 days    [facility] 05/04/2016, 9:15 AM Pager: [phone] Consults: [phone] Mon-Fri 7:00 am-4:30 pm Sat-Sun 7:00 am-11:30 am

## 2016-05-04 NOTE — Evaluation (Signed)
Occupational Therapy Evaluation Patient Details Name: Rachel ElksRalice Wong MRN: [id number] DOB: [birth year] Today's Date: 05/04/2016    History of Present Illness 75 y.o. female with a Past Medical History of significant for HTN, HLD who presents with abd pain. GI onboard. ERCP likely in next 1-2 days for choledocholithiasis followed by GB removal   Clinical Impression   PTA, pt was independent with ADL and IADL and was working and actively volunteering at her church. Pt currently requires max assist for LB ADL due to abdominal pain and mod assist for functional transfers. Pt would benefit from continued OT services while admitted to improve independence with ADL. Recommend D/C to SNF for continued rehabilitation services to maximize independence with ADL and ensure a safe D/C home. OT will continue to follow acutely.    Follow Up Recommendations  SNF    Equipment Recommendations  Other (comment) (TBD at next venue of care)       Precautions / Restrictions Precautions Precautions: Fall Precaution Comments: wears custom shoes due to toe amuptations Restrictions Weight Bearing Restrictions: No      Mobility Bed Mobility               General bed mobility comments: Received in recliner  Transfers Overall transfer level: Needs assistance Equipment used: Rolling walker (2 wheeled) Transfers: Sit to/from Stand Sit to Stand: Mod assist;Min assist         General transfer comment: Mod A from recliner, min A from commode. Cues for both to scoot to edge of seat.    Balance Overall balance assessment: Needs assistance Sitting-balance support: No upper extremity supported;Feet supported Sitting balance-Leahy Scale: Good     Standing balance support: Single extremity supported;During functional activity Standing balance-Leahy Scale: Fair                              ADL Overall ADL's : Needs assistance/impaired Eating/Feeding: Set up;Sitting   Grooming: Wash/dry  hands;Min guard;Standing   Upper Body Bathing: Minimal assitance;Sitting   Lower Body Bathing: Maximal assistance;Sit to/from stand   Upper Body Dressing : Minimal assistance;Sitting   Lower Body Dressing: Maximal assistance;Sit to/from stand   Toilet Transfer: Moderate assistance;Ambulation;RW;Regular Toilet   Toileting- Clothing Manipulation and Hygiene: Supervision/safety;Sitting/lateral lean       Functional mobility during ADLs: Min guard;Rolling walker General ADL Comments: Educated pt on safety during ADL, safe use of DME, and energy conservation techniques. Pt requiring max assist for LB ADL due to pain in abdomen with bending.     Vision Vision Assessment?: No apparent visual deficits          Pertinent Vitals/Pain Pain Assessment: Faces Faces Pain Scale: Hurts little more Pain Location: abdomen  Pain Descriptors / Indicators: Sore Pain Intervention(s): Limited activity within patient's tolerance;Monitored during session;Repositioned     Hand Dominance Right   Extremity/Trunk Assessment Upper Extremity Assessment Upper Extremity Assessment: Generalized weakness   Lower Extremity Assessment Lower Extremity Assessment: Generalized weakness       Communication Communication Communication: No difficulties   Cognition Arousal/Alertness: Awake/alert Behavior During Therapy: WFL for tasks assessed/performed Overall Cognitive Status: Within Functional Limits for tasks assessed                                Home Living Family/patient expects to be discharged to:: Private residence Living Arrangements: Alone Available Help at Discharge: Family Type of Home: House Home  Access: Stairs to enter Entergy CorporationEntrance Stairs-Number of Steps: 1 Entrance Stairs-Rails: None Home Layout: One level     Bathroom Shower/Tub: Tub/shower unit Shower/tub characteristics: Curtain FirefighterBathroom Toilet: Standard     Home Equipment: Environmental consultantWalker - 2 wheels;Shower seat;Cane -  single point   Additional Comments: Now planning to D/C to SNF and then to daughter's home      Prior Functioning/Environment Level of Independence: Independent        Comments: ocassional use of SPC when standing for extended time        OT Problem List: Decreased strength;Decreased range of motion;Decreased activity tolerance;Decreased knowledge of use of DME or AE;Decreased safety awareness;Pain;Impaired balance (sitting and/or standing)   OT Treatment/Interventions: Self-care/ADL training;Therapeutic exercise;Therapeutic activities;Patient/family education;Balance training;Energy conservation    OT Goals(Current goals can be found in the care plan section) Acute Rehab OT Goals Patient Stated Goal: to get better OT Goal Formulation: With patient Time For Goal Achievement: 05/18/16 Potential to Achieve Goals: Good ADL Goals Pt Will Perform Lower Body Bathing: with modified independence;sit to/from stand Pt Will Perform Lower Body Dressing: with modified independence;sit to/from stand Pt Will Transfer to Toilet: with modified independence;ambulating;regular height toilet;grab bars Pt Will Perform Toileting - Clothing Manipulation and hygiene: with modified independence;sit to/from stand Pt Will Perform Tub/Shower Transfer: Tub transfer;Shower transfer;with modified independence;shower seat;ambulating;rolling walker Additional ADL Goal #1: Pt will independently report 3 energy conservation strategies to apply during ADL as a precursor to improved ADL independence.  OT Frequency: Min 1X/week    End of Session Equipment Utilized During Treatment: Gait belt;Rolling walker  Activity Tolerance: Patient tolerated treatment well Patient left: in chair;with call bell/phone within reach   Time: 1031-1102 OT Time Calculation (min): 31 min Charges:  OT General Charges $OT Visit: 1 Procedure OT Evaluation $OT Eval Moderate Complexity: 1 Procedure OT Treatments $Self Care/Home  Management : 8-22 mins  Doristine SectionCharity A Rayquon Uselman, OTR/L [phone] 05/04/2016, 12:08 PM

## 2016-05-04 NOTE — Progress Notes (Signed)
     SUBJECTIVE: No chest pain. No dyspnea.   Tele: sinus with PACs  BP (!) 158/54 (BP Location: Left Arm)   Pulse 71   Temp 98.2 F (36.8 C) (Oral)   Resp 18   Ht 5\' 6"  (1.676 m)   Wt 185 lb 6.5 oz (84.1 kg)   SpO2 93%   BMI 29.93 kg/m   Intake/Output Summary (Last 24 hours) at 05/04/16 0819 Last data filed at 05/04/16 [id number]  Gross per 24 hour  Intake              880 ml  Output             1230 ml  Net             -350 ml    PHYSICAL EXAM General: Well developed, well nourished, in no acute distress. Alert and oriented x 3.  Psych:  Good affect, responds appropriately Neck: No JVD. No masses noted.  Lungs: Clear bilaterally with no wheezes or rhonci noted.  Heart: RRR with no murmurs noted. Abdomen: Bowel sounds are present. Soft, non-tender.  Extremities: No lower extremity edema.   LABS: Basic Metabolic Panel:  Recent Labs  11/91/4711/25/17 0347 05/02/16 1901 05/03/16 0808  NA 138 137  --   K 3.6 3.9  --   CL 107 106  --   CO2 23 22  --   GLUCOSE 222* 260*  --   BUN 10 9  --   CREATININE 0.73 0.91  --   CALCIUM 8.3* 7.9*  --   MG  --   --  1.5*   CBC:  Recent Labs  05/02/16 0347 05/03/16 0808  WBC 4.9 8.8  HGB 9.7* 9.9*  HCT 28.8* 30.0*  MCV 82.3 84.0  PLT 225 231   Current Meds: . enalapril  20 mg Oral Daily  . enoxaparin (LOVENOX) injection  40 mg Subcutaneous Q24H  . insulin aspart  0-9 Units Subcutaneous TID WC  . metoprolol succinate  100 mg Oral Daily  . sodium chloride flush  3 mL Intravenous Q12H   ASSESSMENT AND PLAN: 75 yo   1. Atrial fibrillation, paroxysmal: Possible atrial fib pre-op. She has maintained sinus rhythm. Some PACs. No changes today. Anti-coagulation is not indicated. Cardiology follow up as outpatient as planned with Dr. Rennis GoldenHilty.   2. Cholecystitis: s/p lap chole.   Will sign off. Please call with questions.   Verne CarrowChristopher McAlhany  11/27/20178:19 AM

## 2016-05-04 NOTE — Progress Notes (Signed)
Patient will DC to: [facility] Anticipated DC date: 05/04/16 Family notified: Daughter Transport by: Daughter by car   Per MD patient ready for DC to . RN, patient, patient's family, and facility notified of DC. Discharge Summary sent to facility. RN given number for report [phone]).  CSW signing off.  Cristobal GoldmannNadia Ailey Wessling, ConnecticutLCSWA Clinical Social Worker [phone]

## 2016-05-04 NOTE — Discharge Summary (Signed)
Physician Discharge Summary  Merceda ElksRalice Pankow XLK:[id number] DOB: 04-15-1941 DOA: 04/27/2016  PCP: Raynelle JanSPRY,HEATHER M., MD  Admit date: 04/27/2016 Discharge date: 05/04/2016  Admitted From: Home Disposition: SNF  Recommendations for Outpatient Follow-up:  1. Follow up with Surgery as scheduled, cardiology as scheduled, PCP in 2-3 weeks 2. Please obtain BMP/CBC in 1-2 weeks 3. Monitor blood glucose 4 times per day and adjust insulin as needed for good glycemic control   Discharge Condition: Stable  CODE STATUS: FULL Diet recommendation: Heart Healthy / Carb Modified  Brief/Interim Summary: HPI: Rachel Inda Wong is a 75 y.o. female with medical history significant for HTN, HLD,[facility] with acute mid abdominal pain, nausea and  Non bloody vomiting since 2 am.  She was in her usual state of health until 2 days prior, developing mild nausea and vomiting, quickly resolving, without any recurrence until early a.m, accompanied by severe pain. Denies diarrhea. PAin does not extend to any surrounding areas. Pain at this time is 8/10 .  Denies any food poisoning . No recent trips. Denies fevers, chills, night sweats, vision changes, or mucositis. Denies any respiratory complaints. Denies any chest pain , but does have a history of intermitent palpitations, not seen by Cards for at least 1 yr. Denies lower extremity swelling.  Denies any dysuria. Denies abnormal skin rashes, or neuropathy.  She was treated with antibiotics until 2 days ago for diabetic ulcer at the first metatarsal (right) area,which iis now healed. Denies any bleeding issues such as epistaxis, ematuria or hematochezia. Ambulating without difficulty.  ED Course:  BP (!) 156/58 (BP Location: Left Arm)   Pulse 80   Temp 98 F (36.7 C) (Oral)   Resp (!) 23   Ht 5\' 6"  (1.676 m)   Wt 80.2 kg (176 lb 12.9 oz)   SpO2 97%   BMI 28.54 kg/m    WBC 11.6 hemoglobin 11.8 platelets 338 the UN 40 creatinine 0.91 troponin 0.03 glucose 303  lactic acid 1.25  Abnormal liver function with ALP 946, AST 183, ALT 180, total bilirubin 1.9. Lipase 6436. [facility],  Found to have choledocholithiasis with severe duct dilatation. IV Zosyn was given in ED. Will started cardizem gtt now. [facility] of care   Brief Narrative:  [age] yoF w/ a Hx of HTN,PVD,HLD, DM2, and a Diabetic ulcer of left great toe who [facility] with acute mid abdominal pain, nausea, and non-bloody vomiting. Patient was admitted for choledocholithiasis with obstruction, gallstones pancreatitis. GI was consulted ERCP was done with sphincterectomy and papillotomy. Surgery was consulted for cholecystectomy.  Subjective: Doing well, no pain tolerating diet, ambulating with PT whom recommended SNF. Wanting to be discharged today.    Assessment & Plan:  Choledocholithiasis with obstruction as evidenced by CT abdomen and pelvis - s/p lap chole day 2 - empyema of the gallbladder with large stones, JP drain removed  Pain control as needed Oral antibiotics x 5 more days per general surgery Tolerated diet Surgery follow up appointment made Encouraged incentive spirometry  Acute pancreatitis secondary to gallstone - Resolved  Status post ERCP Lipase normal   Atrial Fibrillation with RVR short duration converted to sinus rhythm after Cardizem - stable Cardio recommendations appreciated No anticoagulation needed- cardiology will follow as an outpatient.  Acute renal injury - resolved with fluids  Prerenal due to infection   HTN - stable  Continue enalapril  Continue metoprolol Monitor BP  Dm2  Metformin and basal and prandial insulin ordered Monitor BS 4  times per day  Holding home Victoza for now. Follow up with endocrinologist  Grade 2 diastolic dysfunction - newly appreciated No signs of fluid overload, follow up with Dr. Rennis Golden outpatient.   DVT prophylaxis: Lovenox  Code Status: Full Family Communication:  Daughter bedside Disposition Plan: SNF   Consultants:   [facility] Sprang GI  [facility] cardiology  Gen. 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  Antimicrobials:  Zosyn 11/19  Discharge Diagnoses:  Active Problems:   Palpitations   HTN (hypertension)   DM2 (diabetes mellitus, type 2) (HCC)   Hyperlipidemia   Choledocholithiasis with obstruction   Atrial fibrillation (HCC)   Choledocholithiasis   Atrial fibrillation with rapid ventricular response (HCC)   Pancreatitis due to common bile duct stone   Uncontrolled type 2 diabetes mellitus with complication [facility])  Discharge Instructions  Discharge Instructions    Discharge instructions    Complete by:  As directed    Please monitor blood sugar 4 times per day and adjust insulin doses as needed.   Increase activity slowly    Complete by:  As directed        Medication List    STOP taking these medications   alendronate 70 MG tablet Commonly known as:  FOSAMAX   aspirin EC 81 MG tablet   VICTOZA 18 MG/3ML Sopn Generic drug:  liraglutide     TAKE these medications   calcium citrate 950 MG tablet Commonly known as:  CALCITRATE - dosed in mg elemental calcium Take 1 tablet by mouth daily.   doxycycline 100 MG capsule Commonly known as:  VIBRAMYCIN Take 1 capsule (100 mg total) by mouth 2 (two) times daily.   enalapril 20 MG tablet Commonly known as:  VASOTEC Take 20 mg by mouth daily.   Fish Oil 1000 MG Caps Take 1 capsule by mouth daily.   HYDROcodone-acetaminophen 5-325 MG tablet Commonly known as:  NORCO/VICODIN Take 1 tablet by mouth every 6 (six) hours as needed for severe pain.   insulin aspart 100 UNIT/ML injection Commonly known as:  novoLOG Inject 3 Units into the skin 3 (three) times daily with meals. Give only if eats 50% or more of meal.   insulin glargine 100 UNIT/ML  injection Commonly known as:  LANTUS Inject 0.08 mLs (8 Units total) into the skin at bedtime.   magnesium oxide 400 MG tablet Commonly known as:  MAG-OX Take 400 mg by mouth daily.   metFORMIN 500 MG 24 hr tablet Commonly known as:  GLUCOPHAGE-XR Take 1 tablet (500 mg total) by mouth 2 (two) times daily. What changed:  how much to take   metoprolol succinate 100 MG 24 hr tablet Commonly known as:  TOPROL-XL Take 50 mg by mouth daily.   multivitamin capsule Take 1 capsule by mouth daily.   pravastatin 40 MG tablet Commonly known as:  PRAVACHOL Take 1 tablet by mouth daily.      Follow-up Information    [facility], [place]. Go on 05/19/2016.   Specialty:  General Surgery Why:  at 3:15 PM for post-operative follow up. please arrive 30 minutes early to get checked in and fill out any necessary paperwork. Contact information: [address] [place] [address] [phone]       Chrystie Nose, MD. Schedule an appointment as soon as possible for a visit in 3 week(s).  Specialty:  Cardiology Why:  Hospital Follow uP  Contact information: [address] [place] [address] [phone]        Raynelle Jan., MD. Schedule an appointment as soon as possible for a visit in 3 week(s).   Specialty:  Family Medicine Why:  Hospital Follow Up  Contact information: [address] [place] [address] [phone]          No Known Allergies  Procedures/Studies: X-ray Chest Pa And Lateral  Result Date: 04/28/2016 CLINICAL DATA:  Choledocholithiasis, preoperative assessment EXAM: CHEST  2 VIEW COMPARISON:  08/15/2015 FINDINGS: Normal heart size, mediastinal contours, and pulmonary vascularity. Peribronchial thickening with bibasilar atelectasis RIGHT greater than LEFT. Upper lungs clear. No pleural effusion or pneumothorax. Bones demineralized with chronic RIGHT rotator cuff tear. IMPRESSION: Mild  bronchitic changes with bibasilar atelectasis RIGHT greater than LEFT. Electronically Signed   By: Ulyses Southward M.D.   On: 04/28/2016 07:58   Ct Abdomen Pelvis W Contrast  Result Date: 04/27/2016 CLINICAL DATA:  Abdominal pain.  Vomiting. EXAM: CT ABDOMEN AND PELVIS WITH CONTRAST TECHNIQUE: Multidetector CT imaging of the abdomen and pelvis was performed using the standard protocol following bolus administration of intravenous contrast. CONTRAST:  ISOVUE-300 IOPAMIDOL (ISOVUE-300) INJECTION 61% COMPARISON:  CT abdomen pelvis 08/05/2015 FINDINGS: Lower chest: No pulmonary nodules. No visible pleural or pericardial effusion. Hepatobiliary: There is intrahepatic and extrahepatic biliary dilatation. There is a 3-4 mm stone within the distal common bile duct. The common bile duct is dilated up to 12 mm. Multiple stones are also seen within the gallbladder. Pancreas: Normal pancreatic contours and enhancement. No peripancreatic fluid collection or pancreatic ductal dilatation. Spleen: Normal. Adrenals/Urinary Tract: Normal adrenal glands. No hydronephrosis or solid renal mass. Stomach/Bowel: No abnormal bowel dilatation. No bowel wall thickening or adjacent fat stranding to indicate acute inflammation. No abdominal fluid collection. The appendix is not clearly seen. Vascular/Lymphatic: There is aortic atherosclerosis. Extensive calcification of the splenic artery. No abdominal or pelvic lymphadenopathy. Reproductive: Normal uterus and ovaries. Musculoskeletal: No lytic or blastic osseous lesion. Normal visualized extrathoracic and extraperitoneal soft tissues. Other: No contributory non-categorized findings. IMPRESSION: 1. Choledocholithiasis with severe resultant intrahepatic and extrahepatic biliary dilatation. 2. Extensive atherosclerotic calcification of the aorta, coronary arteries and abdominal arteries. 3. Cholelithiasis. Electronically Signed   By: Deatra Robinson M.D.   On: 04/27/2016 05:44   Dg  Ercp  Result Date: 04/29/2016 CLINICAL DATA:  Palpitations EXAM: ERCP TECHNIQUE: Multiple spot images obtained with the fluoroscopic device and submitted for interpretation post-procedure. FLUOROSCOPY TIME:  Fluoroscopy Time:  1 minutes and 53 seconds Radiation Exposure Index (if provided by the fluoroscopic device): Number of Acquired Spot Images: 2 COMPARISON:  None. FINDINGS: Contrast fills the biliary tree and duodenum there are no filling defects in the common bile duct. The common bile duct is mildly dilated. IMPRESSION: See above. These images were submitted for radiologic interpretation only. Please see the procedural report for the amount of contrast and the fluoroscopy time utilized. Electronically Signed   By: Jolaine Click M.D.   On: 04/29/2016 13:52   Subjective: Pt reports that she is feeling great today and anxious to get to rehab.   Discharge Exam: Vitals:   05/04/16 0654 05/04/16 1341  BP: (!) 158/54 (!) 163/52  Pulse: 71 69  Resp: 18 18  Temp: 98.2 F (36.8 C) 98.5 F (36.9 C)   Vitals:   05/03/16 1250 05/03/16 2300 05/04/16 0654 05/04/16 1341  BP: (!) 147/45 (!) 138/56 Marland Kitchen)  158/54 (!) 163/52  Pulse: 66 74 71 69  Resp: 19 18 18 18   Temp: 99.1 F (37.3 C) 98.3 F (36.8 C) 98.2 F (36.8 C) 98.5 F (36.9 C)  TempSrc: Oral Oral Oral Oral  SpO2: 94% 95% 93% 95%  Weight:      Height:       General: Well developed, well nourished, in no acute distress. Alert and oriented x 3.  Psych:  Good affect, responds appropriately Neck: No JVD. No masses noted.  Lungs: Clear bilaterally with no wheezes or rhonci noted.  Heart: RRR with no murmurs noted. Abdomen: Bowel sounds are present. Soft, non-tender.  Extremities: No lower extremity edema.   The results of significant diagnostics from this hospitalization (including imaging, microbiology, ancillary and laboratory) are listed below for reference.     Microbiology: Recent Results (from the past 240 hour(s))  MRSA PCR  Screening     Status: None   Collection Time: 04/27/16  9:52 AM  Result Value Ref Range Status   MRSA by PCR NEGATIVE NEGATIVE Final    Comment:        The GeneXpert MRSA Assay (FDA approved for NASAL specimens only), is one component of a comprehensive MRSA colonization surveillance program. It is not intended to diagnose MRSA infection nor to guide or monitor treatment for MRSA infections.   Urine culture     Status: Abnormal   Collection Time: 04/27/16 10:17 AM  Result Value Ref Range Status   Specimen Description URINE, CLEAN CATCH  Final   Special Requests NONE  Final   Culture MULTIPLE SPECIES PRESENT, SUGGEST RECOLLECTION (A)  Final   Report Status 04/28/2016 FINAL  Final  Culture, blood (Routine X 2) w Reflex to ID Panel     Status: None   Collection Time: 04/27/16 10:48 AM  Result Value Ref Range Status   Specimen Description BLOOD LEFT ANTECUBITAL  Final   Special Requests BOTTLES DRAWN AEROBIC ONLY 5CC  Final   Culture NO GROWTH 5 DAYS  Final   Report Status 05/02/2016 FINAL  Final  Culture, blood (Routine X 2) w Reflex to ID Panel     Status: None   Collection Time: 04/27/16 10:58 AM  Result Value Ref Range Status   Specimen Description BLOOD BLOOD LEFT ARM  Final   Special Requests BOTTLES DRAWN AEROBIC ONLY 5CC  Final   Culture NO GROWTH 5 DAYS  Final   Report Status 05/02/2016 FINAL  Final     Labs: BNP (last 3 results) No results for input(s): BNP in the last 8760 hours. Basic Metabolic Panel:  Recent Labs Lab 04/30/16 1340 05/01/16 0028 05/02/16 0347 05/02/16 1901 05/03/16 0808 05/04/16 0815  NA 136 136 138 137  --  134*  K 3.8 3.8 3.6 3.9  --  3.8  CL 106 107 107 106  --  102  CO2 22 23 23 22   --  26  GLUCOSE 198* 228* 222* 260*  --  305*  BUN 17 14 10 9   --  10  CREATININE 0.90 0.87 0.73 0.91  --  0.89  CALCIUM 8.0* 7.8* 8.3* 7.9*  --  8.3*  MG  --   --   --   --  1.5* 1.8   Liver Function Tests:  Recent Labs Lab 04/30/16 1340  05/01/16 0028 05/02/16 0347 05/02/16 1901 05/04/16 0815  AST 42* 27 23 80* 31  ALT 86* 63* 47 60* 37  ALKPHOS 493* 410* 367* 335* 269*  BILITOT  1.4* 0.8 0.6 0.5 0.7  PROT 5.8* 4.9* 5.1* 5.1* 5.2*  ALBUMIN 2.2* 2.0* 2.1* 2.1* 2.1*    Recent Labs Lab 04/30/16 0320 05/01/16 0028 05/02/16 0347 05/03/16 0808  LIPASE 210* 200* 124* 33   No results for input(s): AMMONIA in the last 168 hours. CBC:  Recent Labs Lab 04/28/16 0211 04/30/16 0320 05/01/16 0028 05/02/16 0347 05/03/16 0808  WBC 12.4* 7.1 5.1 4.9 8.8  HGB 10.1* 9.5* 9.5* 9.7* 9.9*  HCT 31.0* 29.1* 28.6* 28.8* 30.0*  MCV 84.0 83.4 82.7 82.3 84.0  PLT 266 225 204 225 231   Cardiac Enzymes: No results for input(s): CKTOTAL, CKMB, CKMBINDEX, TROPONINI in the last 168 hours. BNP: Invalid input(s): POCBNP CBG:  Recent Labs Lab 05/03/16 1157 05/03/16 1721 05/03/16 2256 05/04/16 0752 05/04/16 1228  GLUCAP 354* 306* 274* 280* 375*   D-Dimer No results for input(s): DDIMER in the last 72 hours. Hgb A1c No results for input(s): HGBA1C in the last 72 hours. Lipid Profile No results for input(s): CHOL, HDL, LDLCALC, TRIG, CHOLHDL, LDLDIRECT in the last 72 hours. Thyroid function studies No results for input(s): TSH, T4TOTAL, T3FREE, THYROIDAB in the last 72 hours.  Invalid input(s): FREET3 Anemia work up No results for input(s): VITAMINB12, FOLATE, FERRITIN, TIBC, IRON, RETICCTPCT in the last 72 hours. Urinalysis No results found for: COLORURINE, APPEARANCEUR, LABSPEC, PHURINE, GLUCOSEU, HGBUR, BILIRUBINUR, KETONESUR, PROTEINUR, UROBILINOGEN, NITRITE, LEUKOCYTESUR Sepsis Labs Invalid input(s): PROCALCITONIN,  WBC,  LACTICIDVEN Microbiology Recent Results (from the past 240 hour(s))  MRSA PCR Screening     Status: None   Collection Time: 04/27/16  9:52 AM  Result Value Ref Range Status   MRSA by PCR NEGATIVE NEGATIVE Final    Comment:        The GeneXpert MRSA Assay (FDA approved for NASAL  specimens only), is one component of a comprehensive MRSA colonization surveillance program. It is not intended to diagnose MRSA infection nor to guide or monitor treatment for MRSA infections.   Urine culture     Status: Abnormal   Collection Time: 04/27/16 10:17 AM  Result Value Ref Range Status   Specimen Description URINE, CLEAN CATCH  Final   Special Requests NONE  Final   Culture MULTIPLE SPECIES PRESENT, SUGGEST RECOLLECTION (A)  Final   Report Status 04/28/2016 FINAL  Final  Culture, blood (Routine X 2) w Reflex to ID Panel     Status: None   Collection Time: 04/27/16 10:48 AM  Result Value Ref Range Status   Specimen Description BLOOD LEFT ANTECUBITAL  Final   Special Requests BOTTLES DRAWN AEROBIC ONLY 5CC  Final   Culture NO GROWTH 5 DAYS  Final   Report Status 05/02/2016 FINAL  Final  Culture, blood (Routine X 2) w Reflex to ID Panel     Status: None   Collection Time: 04/27/16 10:58 AM  Result Value Ref Range Status   Specimen Description BLOOD BLOOD LEFT ARM  Final   Special Requests BOTTLES DRAWN AEROBIC ONLY 5CC  Final   Culture NO GROWTH 5 DAYS  Final   Report Status 05/02/2016 FINAL  Final   Time coordinating discharge: 32 minutes  SIGNED:  Standley Dakinslanford Johnson, MD  [facility] 05/04/2016, 2:46 PM Pager   If 7PM-7AM, please contact night-coverage [email or web address] Password [email or web address]

## 2016-05-04 NOTE — Progress Notes (Signed)
Inpatient Diabetes Program Recommendations  AACE/ADA: New Consensus Statement on Inpatient Glycemic Control (2015)  Target Ranges:  Prepandial:   less than 140 mg/dL      Peak postprandial:   less than 180 mg/dL (1-2 hours)      Critically ill patients:  140 - 180 mg/dL   Lab Results  Component Value Date   GLUCAP 280 (H) 05/04/2016   HGBA1C 7.5 (H) 04/27/2016   Results for Rachel Wong, Rachel Wong (MRN [id number]) as of 05/04/2016 11:18  Ref. Range 05/03/2016 07:42 05/03/2016 11:57 05/03/2016 17:21 05/03/2016 22:56 05/04/2016 07:52  Glucose-Capillary Latest Ref Range: 65 - 99 mg/dL 621198 (H) 308354 (H) 657306 (H) 274 (H) 280 (H)   Review of Glycemic Control  Diabetes history:       DM; DM Ulcer of left great toe; overweight, GFR, BUN & Creatinine WNL Outpatient Diabetes medications:       Victoza 1.8 mg Q am,Metformin XR 1000 mg BID Current orders for Inpatient glycemic control:       [facility]  Inpatient Diabetes Program Recommendations:      Please consider:           Basal insulin Lantus 8 units daily starting now (84Kg x 0.1 units/ Kg);           Novolog 0-5 units QHS; and           Meal Coverage of Novolog 5 units TIDAC if patient eats >50% of meal.  Thank you,  Kristine LineaKaren Tinita Brooker, RN, BSN Diabetes Coordinator Inpatient Diabetes Program [phone] (Team Pager)

## 2016-05-04 NOTE — Clinical Social Work Placement (Signed)
   CLINICAL SOCIAL WORK PLACEMENT  NOTE  Date:  05/04/2016  Patient Details  Name: Rachel Wong MRN: [id number] Date of Birth: [birth year]  Clinical Social Work is seeking post-discharge placement for this patient at the Skilled  Nursing Facility level of care (*CSW will initial, date and re-position this form in  chart as items are completed):  Yes   Patient/family provided with [place] Clinical Social Work Department's list of facilities offering this level of care within the geographic area requested by the patient (or if unable, by the patient's family).  Yes   Patient/family informed of their freedom to choose among providers that offer the needed level of care, that participate in Medicare, Medicaid or managed care program needed by the patient, have an available bed and are willing to accept the patient.  Yes   Patient/family informed of [facility] ownership interest in [facility] and [facility], as well as of the fact that they are under no obligation to receive care at these facilities.  PASRR submitted to EDS on       PASRR number received on       Existing PASRR number confirmed on 05/04/16     FL2 transmitted to all facilities in geographic area requested by pt/family on 05/04/16     FL2 transmitted to all facilities within larger geographic area on       Patient informed that his/her managed care company has contracts with or will negotiate with certain facilities, including the following:        Yes   Patient/family informed of bed offers received.  [facility]     Physician recommends and patient chooses bed at      [facility]16.  Patient to be transferred to facility by Daughter by car     Patient family notified on 05/04/16 of transfer.  Name of family member notified:  Daughter     PHYSICIAN Please sign FL2     Additional Comment:     _______________________________________________ Mearl LatinNadia S Janine Reller, LCSWA 05/04/2016, 3:17 PM

## 2016-05-04 NOTE — Progress Notes (Signed)
Pasrr: [id number] [phone] [id number]  Loyal Bubaadia Falesha Schommer LCSWA [phone]

## 2016-05-04 NOTE — Discharge Instructions (Signed)
Low-Fat Diet for Pancreatitis or Gallbladder Conditions A low-fat diet can be helpful if you have pancreatitis or a gallbladder condition. With these conditions, your pancreas and gallbladder have trouble digesting fats. A healthy eating plan with less fat will help rest your pancreas and gallbladder and reduce your symptoms. What do I need to know about this diet?  Eat a low-fat diet.  Reduce your fat intake to less than 20-30% of your total daily calories. This is less than 50-60 g of fat per day.  Remember that you need some fat in your diet. Ask your dietician what your daily goal should be.  Choose nonfat and low-fat healthy foods. Look for the words nonfat, low fat, or fat free.  As a guide, look on the label and choose foods with less than 3 g of fat per serving. Eat only one serving.  Avoid alcohol.  Do not smoke. If you need help quitting, talk with your health care provider.  Eat small frequent meals instead of three large heavy meals. What foods can I eat? Grains  Include healthy grains and starches such as potatoes, wheat bread, fiber-rich cereal, and brown rice. Choose whole grain options whenever possible. In adults, whole grains should account for 45-65% of your daily calories. Fruits and Vegetables  Eat plenty of fruits and vegetables. Fresh fruits and vegetables add fiber to your diet. Meats and Other Protein Sources  Eat lean meat such as chicken and pork. Trim any fat off of meat before cooking it. Eggs, fish, and beans are other sources of protein. In adults, these foods should account for 10-35% of your daily calories. Dairy  Choose low-fat milk and dairy options. Dairy includes fat and protein, as well as calcium. Fats and Oils  Limit high-fat foods such as fried foods, sweets, baked goods, sugary drinks. Other  Creamy sauces and condiments, such as mayonnaise, can add extra fat. Think about whether or not you need to use them, or use smaller amounts or low  fat options. What foods are not recommended?  High fat foods, such as:  [employer].  Ice cream.  [place] toast.  Sweet rolls.  Pizza.  Cheese bread.  Foods covered with batter, butter, creamy sauces, or cheese.  Fried foods.  Sugary drinks and desserts.  Foods that cause gas or bloating This information is not intended to replace advice given to you by your health care provider. Make sure you discuss any questions you have with your health care provider. Document Released: 05/30/2013 Document Revised: 10/31/2015 Document Reviewed: 05/08/2013 Elsevier Interactive Patient Education  2017 [place].    Blood Glucose Monitoring, Adult Monitoring your blood sugar (glucose) helps you manage your diabetes. It also helps you and your health care provider determine how well your diabetes management plan is working. Blood glucose monitoring involves checking your blood glucose as often as directed, and keeping a record (log) of your results over time. Why should I monitor my blood glucose? Checking your blood glucose regularly can:  Help you understand how food, exercise, illnesses, and medicines affect your blood glucose.  Let you know what your blood glucose is at any time. You can quickly tell if you are having low blood glucose (hypoglycemia) or high blood glucose (hyperglycemia).  Help you and your health care provider adjust your medicines as needed. When should I check my blood glucose? Follow instructions from your health care provider about how often to check your blood glucose. This may depend on:  The type of  diabetes you have.  How well-controlled your diabetes is.  Medicines you are taking. If you have type 1 diabetes:  Check your blood glucose at least 2 times a day.  Also check your blood glucose:  Before every insulin injection.  Before and after exercise.  Between meals.  2 hours after a meal.  Occasionally between 2:00 a.m. and 3:00 a.m., as  directed.  Before potentially dangerous tasks, like driving or using heavy machinery.  At bedtime.  You may need to check your blood glucose more often, up to 6-10 times a day:  If you use an insulin pump.  If you need multiple daily injections (MDI).  If your diabetes is not well-controlled.  If you are ill.  If you have a history of severe hypoglycemia.  If you have a history of not knowing when your blood glucose is getting low (hypoglycemia unawareness). If you have type 2 diabetes:  If you take insulin or other diabetes medicines, check your blood glucose at least 2 times a day.  If you are on intensive insulin therapy, check your blood glucose at least 4 times a day. Occasionally, you may also need to check between 2:00 a.m. and 3:00 a.m., as directed.  Also check your blood glucose:  Before and after exercise.  Before potentially dangerous tasks, like driving or using heavy machinery.  You may need to check your blood glucose more often if:  Your medicine is being adjusted.  Your diabetes is not well-controlled.  You are ill. What is a blood glucose log?  A blood glucose log is a record of your blood glucose readings. It helps you and your health care provider:  Look for patterns in your blood glucose over time.  Adjust your diabetes management plan as needed.  Every time you check your blood glucose, write down your result and notes about things that may be affecting your blood glucose, such as your diet and exercise for the day.  Most glucose meters store a record of glucose readings in the meter. Some meters allow you to download your records to a computer. How do I check my blood glucose? Follow these steps to get accurate readings of your blood glucose: Supplies needed   Blood glucose meter.  Test strips for your meter. Each meter has its own strips. You must use the strips that come with your meter.  A needle to prick your finger (lancet). Do not  use lancets more than once.  A device that holds the lancet (lancing device).  A journal or log book to write down your results. Procedure  Wash your hands with soap and water.  Prick the side of your finger (not the tip) with the lancet. Use a different finger each time.  Gently rub the finger until a small drop of blood appears.  Follow instructions that come with your meter for inserting the test strip, applying blood to the strip, and using your blood glucose meter.  Write down your result and any notes. Alternative testing sites  Some meters allow you to use areas of your body other than your finger (alternative sites) to test your blood.  If you think you may have hypoglycemia, or if you have hypoglycemia unawareness, do not use alternative sites. Use your finger instead.  Alternative sites may not be as accurate as the fingers, because blood flow is slower in these areas. This means that the result you get may be delayed, and it may be different from the result  that you would get from your finger.  The most common alternative sites are:  Forearm.  Thigh.  Palm of the hand. Additional tips  Always keep your supplies with you.  If you have questions or need help, all blood glucose meters have a 24-hour hotline number that you can call. You may also contact your health care provider.  After you use a few boxes of test strips, adjust (calibrate) your blood glucose meter by following instructions that came with your meter. This information is not intended to replace advice given to you by your health care provider. Make sure you discuss any questions you have with your health care provider. Document Released: 05/28/2003 Document Revised: 12/13/2015 Document Reviewed: 11/04/2015 Elsevier Interactive Patient Education  2017 [place].   Hypoglycemia  Hypoglycemia is when the sugar (glucose) level in the blood is too low. Symptoms of low blood sugar may  include:  Feeling:  Hungry.  Worried or nervous (anxious).  Sweaty and clammy.  Confused.  Dizzy.  Sleepy.  Sick to your stomach (nauseous).  Having:  A fast heartbeat.  A headache.  A change in your vision.  Jerky movements that you cannot control (seizure).  Nightmares.  Tingling or no feeling (numbness) around the mouth, lips, or tongue.  Having trouble with:  Talking.  Paying attention (concentrating).  Moving (coordination).  Sleeping.  Shaking.  Passing out (fainting).  Getting upset easily (irritability). Low blood sugar can happen to people who have diabetes and people who do not have diabetes. Low blood sugar can happen quickly, and it can be an emergency. Treating Low Blood Sugar  Low blood sugar is often treated by eating or drinking something sugary right away. If you can think clearly and swallow safely, follow the 15:15 rule:  Take 15 grams of a fast-acting carb (carbohydrate). Some fast-acting carbs are:  1 tube of glucose gel.  3 sugar tablets (glucose pills).  6-8 pieces of hard candy.  4 oz (120 mL) of fruit juice.  4 oz (120 mL) of regular (not diet) soda.  Check your blood sugar 15 minutes after you take the carb.  If your blood sugar is still at or below 70 mg/dL (3.9 mmol/L), take 15 grams of a carb again.  If your blood sugar does not go above 70 mg/dL (3.9 mmol/L) after 3 tries, get help right away.  After your blood sugar goes back to normal, eat a meal or a snack within 1 hour. Treating Very Low Blood Sugar  If your blood sugar is at or below 54 mg/dL (3 mmol/L), you have very low blood sugar (severe hypoglycemia). This is an emergency. Do not wait to see if the symptoms will go away. Get medical help right away. Call your local emergency services (911 in the [place] Do not drive yourself to the hospital. If you have very low blood sugar and you cannot eat or drink, you may need a glucagon shot (injection). A family  member or friend should learn how to check your blood sugar and how to give you a glucagon shot. Ask your doctor if you need to have a glucagon shot kit at home. Follow these instructions at home: General instructions  Avoid any diets that cause you to not eat enough food. Talk with your doctor before you start any new diet.  Take over-the-counter and prescription medicines only as told by your doctor.  Limit alcohol to no more than 1 drink per day for nonpregnant women and 2 drinks  per day for men. One drink equals 12 oz of beer, 5 oz of wine, or 1 oz of hard liquor.  Keep all follow-up visits as told by your doctor. This is important. If You Have Diabetes:   Make sure you know the symptoms of low blood sugar.  Always keep a source of sugar with you, such as:  Sugar.  Sugar tablets.  Glucose gel.  Fruit juice.  Regular soda (not diet soda).  Milk.  Hard candy.  Honey.  Take your medicines as told.  Follow your exercise and meal plan.  Eat on time. Do not skip meals.  Follow your sick day plan when you cannot eat or drink normally. Make this plan ahead of time with your doctor.  Check your blood sugar as often as told by your doctor. Always check before and after exercise.  Share your diabetes care plan with:  Your work or school.  People you live with.  Check your pee (urine) for ketones:  When you are sick.  As told by your doctor.  Carry a card or wear jewelry that says you have diabetes. If You Have Low Blood Sugar From Other Causes:   Check your blood sugar as often as told by your doctor.  Follow instructions from your doctor about what you cannot eat or drink. Contact a doctor if:  You have trouble keeping your blood sugar in your target range.  You have low blood sugar often. Get help right away if:  You still have symptoms after you eat or drink something sugary.  Your blood sugar is at or below 54 mg/dL (3 mmol/L).  You have jerky  movements that you cannot control.  You pass out. These symptoms may be an emergency. Do not wait to see if the symptoms will go away. Get medical help right away. Call your local emergency services (911 in the [place] Do not drive yourself to the hospital.  This information is not intended to replace advice given to you by your health care provider. Make sure you discuss any questions you have with your health care provider. Document Released: 08/19/2009 Document Revised: 10/31/2015 Document Reviewed: 06/28/2015 Elsevier Interactive Patient Education  2017 [employer].

## 2016-05-04 NOTE — Progress Notes (Signed)
[facility] by [facility] daughter.

## 2016-06-15 ENCOUNTER — Encounter: Payer: Self-pay | Admitting: Internal Medicine

## 2016-06-15 ENCOUNTER — Ambulatory Visit (INDEPENDENT_AMBULATORY_CARE_PROVIDER_SITE_OTHER): Payer: Medicare Other | Admitting: Internal Medicine

## 2016-06-15 VITALS — BP 132/64 | HR 74 | Ht 66.0 in | Wt 175.2 lb

## 2016-06-15 DIAGNOSIS — I480 Paroxysmal atrial fibrillation: Secondary | ICD-10-CM | POA: Diagnosis not present

## 2016-06-15 DIAGNOSIS — I10 Essential (primary) hypertension: Secondary | ICD-10-CM

## 2016-06-15 DIAGNOSIS — E7800 Pure hypercholesterolemia, unspecified: Secondary | ICD-10-CM

## 2016-06-15 NOTE — Progress Notes (Signed)
OFFICE NOTE  Chief Complaint:  Hospital follow-up  Primary Care Physician: Raynelle JanSPRY,HEATHER M., MD  HPI:  Merceda ElksRalice Liller  is a 76 year old female previously followed by Dr. Clarene DukeLittle with history of palpitations improved with electrolyte replacement and adjustments in her beta blocker. She also has hypertension which has been well controlled, hyperlipidemia and diabetes. She reports being fairly stable. I reviewed results from her primary care provider's office. She recently had a cholesterol check which showed total cholesterol to be 158. Her triglycerides were slightly elevated and she is continuing to work on her diet. She has lost several pounds and I think this will be helpful for her. With her and her blood glucose control, her hemoglobin A1c is 5.8 and has been stable at that level. She denies any further palpitations. She occasionally gets cramps in her left leg and does have varicose veins. There is a slight amount of swelling and she previously had left knee surgery. Other than those few episodes, she is really asymptomatic.  [place] returns today for follow-up. She denies any palpitations. She reports that as long she takes her magnesium she seems to be doing fairly well. She is talking about possibly retiring in December. 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 Her blood pressure is well controlled. She reports her A1c is at 6 which represents good control.  Mrs. Inda CokeGertz returns today for follow-up. She is reportedly asymptomatic, denying any palpitations, chest pain or shortness of breath. She has mildly low blood pressure today although reports a generally is between 120 and 140 systolic. Her EKG shows normal sinus rhythm without any ischemic changes. She occasionally gets some cramping in her legs and her hands. She is takes magnesium oxide but this is not remedy the problem. After talking to her daughter she is convinced this is related to pravastatin. She's been on  this for some time with good cholesterol control.  06/15/2016  Mrs. Inda CokeGertz was seen today in follow-up. I recently saw her when she was hospitalized for acute gallstone pancreatitis. There was a brief episode of atrial fibrillation however frequent PACs were noted. This did not meet candidacy for anticoagulation. She's had no recurrence. Since discharge she has done well. She denies a chest pain or worsening shortness of breath. She is restarted her cholesterol medication and is due for repeat lipid profile. She also saw her endocrinologist who started her on Jardiance.   PMHx:  Past Medical History:  Diagnosis Date  . Choledocholithiasis 04/2016  . Complication of anesthesia   . Diabetes (HCC)   . 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 History of nuclear stress test 2006   adenosine; normal perfusion, no fixed or reversible defects  . History of palpitations   . Hyperlipidemia   . Hypertension   . PONV (postoperative nausea and vomiting)   . PVD (peripheral vascular disease) (HCC)     Past Surgical History:  Procedure Laterality Date  . CARDIAC CATHETERIZATION  05/2000   r/t false-positive stress test; normal coronaries - Dr. Mervyn SkeetersA. Little   . CHOLECYSTECTOMY N/A 05/02/2016   Procedure: LAPAROSCOPIC CHOLECYSTECTOMY WITH INTRAOPERATIVE CHOLANGIOGRAM;  Surgeon: Violeta GelinasBurke Thompson, MD;  [facility];  Service: General;  Laterality: N/A;  . ERCP N/A 04/29/2016   Procedure: ENDOSCOPIC RETROGRADE CHOLANGIOPANCREATOGRAPHY (ERCP);  Surgeon: Dorena CookeyJohn Hayes, MD;  [facility];  Service: Endoscopy;  Laterality: N/A;  . KNEE ARTHROSCOPY Right 10/2001  . ROTATOR CUFF REPAIR  1999  . TOE SURGERY Right  04/2003  . TRANSTHORACIC ECHOCARDIOGRAM  05/2010   EF=>55%, stage 1 diastolic dysfunction; mild MR; mild AVR & AV mildly sclerotic    FAMHx:  Family History  Problem Relation Age of Onset  . Cancer Mother   . Alzheimer's disease Mother   . Lung cancer Father   . Cancer Maternal  Grandmother   . Heart attack Maternal Grandfather   . Liver disease Brother     transplant  . Thyroid disease Child     thyroidectomy    SOCHx:   reports that she quit smoking about 49 years ago. Her smoking use included Cigarettes. She has a 20.00 pack-year smoking history. She has never used smokeless tobacco. Her alcohol and drug histories are not on file.  ALLERGIES:  No Known Allergies  ROS: Pertinent items noted in HPI and remainder of comprehensive ROS otherwise negative.  HOME MEDS: Current Outpatient Prescriptions  Medication Sig Dispense Refill  . calcium citrate (CALCITRATE - DOSED IN MG ELEMENTAL CALCIUM) 950 MG tablet Take 1 tablet by mouth daily.    . empagliflozin (JARDIANCE) 10 MG TABS tablet Take 10 mg by mouth daily.    . enalapril (VASOTEC) 20 MG tablet Take 20 mg by mouth daily.    Marland Kitchen HYDROcodone-acetaminophen (NORCO/VICODIN) 5-325 MG tablet Take 1 tablet by mouth every 6 (six) hours as needed for severe pain. 12 tablet 0  . LANTUS SOLOSTAR 100 UNIT/ML Solostar Pen Inject 14 Units into the skin at bedtime.  0  . magnesium oxide (MAG-OX) 400 MG tablet Take 400 mg by mouth daily.    . metFORMIN (GLUCOPHAGE-XR) 500 MG 24 hr tablet Take 1 tablet (500 mg total) by mouth 2 (two) times daily.    . metoprolol succinate (TOPROL-XL) 100 MG 24 hr tablet Take 50 mg by mouth daily.     . Multiple Vitamin (MULTIVITAMIN) capsule Take 1 capsule by mouth daily.    . Omega-3 Fatty Acids (FISH OIL) 1000 MG CAPS Take 1 capsule by mouth daily.     . pravastatin (PRAVACHOL) 40 MG tablet Take 1 tablet by mouth daily.     No current facility-administered medications for this visit.     LABS/IMAGING: No results found for this or any previous visit (from the past 48 hour(s)). No results found.  VITALS: BP 132/64 (BP Location: Right Arm, Patient Position: Sitting, Cuff Size: Normal)   Pulse 74   Ht 5\' 6"  (1.676 m)   Wt 175 lb 3.2 oz (79.5 kg)   BMI 28.28 kg/m   EXAM: General  appearance: alert and no distress Neck: no adenopathy, no carotid bruit, no JVD, supple, symmetrical, trachea midline and thyroid not enlarged, symmetric, no tenderness/mass/nodules Lungs: clear to auscultation bilaterally Heart: regular rate and rhythm, S1, S2 normal, no murmur, click, rub or gallop Abdomen: soft, non-tender; bowel sounds normal; no masses,  no organomegaly Extremities: extremities normal, atraumatic, no cyanosis or edema Pulses: 2+ and symmetric A few varicose veins in the left leg, mild hemosiderin deposition Skin: Skin color, texture, turgor normal. No rashes or lesions Neurologic: Grossly normal Psych: Mood, affect pleasant, normal  EKG: Sinus rhythm with first-degree AV block at 74  ASSESSMENT: 1. Palpitations-controlled 2. Hypertension-at goal on enalapril and metoprolol 3. Dyslipidemia-On pravastatin 4. 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   Mrs. Wilbanks is doing well without any complaints. She was recently hospitalized with gallstone pancreatitis and underwent cholecystectomy. She had some PAF which was very short-lived and will not need anticoagulation.  There is no evidence for recurrence. I advised her to restart aspirin. Blood pressure appears well controlled. Will recheck a lipid profile. Follow-up in 6 months.  Chrystie Nose, MD, [facility] Attending Cardiologist [facility]  Chrystie Nose 06/15/2016, 9:54 AM

## 2016-06-15 NOTE — Patient Instructions (Signed)
Medication Instructions: Your physician recommends that you continue on your current medications as directed. Please refer to the Current Medication list given to you today.  Labwork: Your physician recommends that you return for a FASTING lipid profile.   Follow-Up: Your physician wants you to follow-up in: 6 months with Dr. Hilty. You will receive a reminder letter in the mail two months in advance. If you don't receive a letter, please call our office to schedule the follow-up appointment.  If you need a refill on your cardiac medications before your next appointment, please call your pharmacy.  

## 2016-07-01 LAB — LIPID PANEL
Cholesterol: 139 mg/dL (ref <200)
HDL: 45 mg/dL — ABNORMAL LOW (ref >50)
LDL CALC: 66 mg/dL (ref <100)
Total CHOL/HDL Ratio: 3.1 Ratio (ref <5.0)
Triglycerides: 139 mg/dL (ref <150)
VLDL: 28 mg/dL (ref <30)

## 2016-07-01 LAB — COMPLETE METABOLIC PANEL WITH GFR
ALT: 12 U/L (ref 6–29)
AST: 19 U/L (ref 10–35)
Albumin: 3.5 g/dL — ABNORMAL LOW (ref 3.6–5.1)
Alkaline Phosphatase: 92 U/L (ref 33–130)
BILIRUBIN TOTAL: 0.4 mg/dL (ref 0.2–1.2)
BUN: 44 mg/dL — AB (ref 7–25)
CO2: 28 mmol/L (ref 20–31)
Calcium: 9.4 mg/dL (ref 8.6–10.4)
Chloride: 105 mmol/L (ref 98–110)
Creat: 0.9 mg/dL (ref 0.60–0.93)
GFR, Est African American: 71 mL/min (ref >=60)
GFR, Est Non African American: 62 mL/min (ref >=60)
GLUCOSE: 96 mg/dL (ref 65–99)
Potassium: 4.5 mmol/L (ref 3.5–5.3)
SODIUM: 141 mmol/L (ref 135–146)
TOTAL PROTEIN: 6.4 g/dL (ref 6.1–8.1)

## 2016-07-02 ENCOUNTER — Encounter: Payer: Self-pay | Admitting: Internal Medicine

## 2016-12-31 ENCOUNTER — Ambulatory Visit (INDEPENDENT_AMBULATORY_CARE_PROVIDER_SITE_OTHER): Payer: Medicare Other | Admitting: Internal Medicine

## 2016-12-31 VITALS — BP 124/56 | HR 62 | Ht 66.0 in | Wt 182.6 lb

## 2016-12-31 DIAGNOSIS — R002 Palpitations: Secondary | ICD-10-CM | POA: Diagnosis not present

## 2016-12-31 DIAGNOSIS — E7800 Pure hypercholesterolemia, unspecified: Secondary | ICD-10-CM

## 2016-12-31 DIAGNOSIS — I10 Essential (primary) hypertension: Secondary | ICD-10-CM | POA: Diagnosis not present

## 2016-12-31 NOTE — Patient Instructions (Signed)
Your physician wants you to follow-up in: 6 months with Dr. Rennis GoldenHilty. You will receive a reminder letter in the mail two months in advance. If you don't receive a letter, please call our office to schedule the follow-up appointment.  Your physician recommends that you return for lab work in January 2019 - prior to your next appointment

## 2016-12-31 NOTE — Progress Notes (Signed)
OFFICE NOTE  Chief Complaint:  No issues  Primary Care Physician: Raynelle JanSpry, Heather M., MD  HPI:  Rachel Wong  is a 76 year old female previously followed by Dr. Clarene DukeLittle with history of palpitations improved with electrolyte replacement and adjustments in her beta blocker. She also has hypertension which has been well controlled, hyperlipidemia and diabetes. She reports being fairly stable. I reviewed results from her primary care provider's office. She recently had a cholesterol check which showed total cholesterol to be 158. Her triglycerides were slightly elevated and she is continuing to work on her diet. She has lost several pounds and I think this will be helpful for her. With her and her blood glucose control, her hemoglobin A1c is 5.8 and has been stable at that level. She denies any further palpitations. She occasionally gets cramps in her left leg and does have varicose veins. There is a slight amount of swelling and she previously had left knee surgery. Other than those few episodes, she is really asymptomatic.  Rachel Wong returns today for follow-up. She denies any palpitations. She reports that as long she takes her magnesium she seems to be doing fairly well. She is talking about possibly retiring in December. 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 Her blood pressure is well controlled. She reports her A1c is at 6 which represents good control.  Rachel Wong returns today for follow-up. She is reportedly asymptomatic, denying any palpitations, chest pain or shortness of breath. She has mildly low blood pressure today although reports a generally is between 120 and 140 systolic. Her EKG shows normal sinus rhythm without any ischemic changes. She occasionally gets some cramping in her legs and her hands. She is takes magnesium oxide but this is not remedy the problem. After talking to her daughter she is convinced this is related to pravastatin. She's been on this for  some time with good cholesterol control.  06/15/2016  Rachel Wong was seen today in follow-up. I recently saw her when she was hospitalized for acute gallstone pancreatitis. There was a brief episode of atrial fibrillation however frequent PACs were noted. This did not meet candidacy for anticoagulation. She's had no recurrence. Since discharge she has done well. She denies a chest pain or worsening shortness of breath. She is restarted her cholesterol medication and is due for repeat lipid profile. She also saw her endocrinologist who started her on Jardiance.   7/26//2018  Rachel Wong returns today for follow-up. Recently she started doing Silver sneakers when a friend told her about it and she found out it was free. She now exercises about a few times a week and uses a Systems analystpersonal trainer. She says she is moving a lot better and has less joint pain. She is less short of breath. She denies any chest pain or recurrent palpitations. Blood pressure is well-controlled today. Her cholesterol is well-controlled in January total cholesterol 139, HDL-C 45, LDL-C 66 and triglycerides 139.  PMHx:  Past Medical History:  Diagnosis Date  . Choledocholithiasis 04/2016  . Complication of anesthesia   . Diabetes (HCC)   . 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 History of nuclear stress test 2006   adenosine; normal perfusion, no fixed or reversible defects  . History of palpitations   . Hyperlipidemia   . Hypertension   . PONV (postoperative nausea and vomiting)   . PVD (peripheral vascular disease) (HCC)     Past Surgical History:  Procedure Laterality Date  .  CARDIAC CATHETERIZATION  05/2000   r/t false-positive stress test; normal coronaries - Dr. Mervyn Skeeters. [facility]   . CHOLECYSTECTOMY N/A 05/02/2016   Procedure: LAPAROSCOPIC CHOLECYSTECTOMY WITH INTRAOPERATIVE CHOLANGIOGRAM;  Surgeon: Violeta Gelinas, MD;  Location: [facility] OR;  Service: General;  Laterality: N/A;  . ERCP N/A 04/29/2016   Procedure: ENDOSCOPIC  RETROGRADE CHOLANGIOPANCREATOGRAPHY (ERCP);  Surgeon: Dorena Cookey, MD;  Location: [facility] ENDOSCOPY;  Service: Endoscopy;  Laterality: N/A;  . KNEE ARTHROSCOPY Right 10/2001  . ROTATOR CUFF REPAIR  1999  . TOE SURGERY Right 04/2003  . TRANSTHORACIC ECHOCARDIOGRAM  05/2010   EF=>55%, stage 1 diastolic dysfunction; mild MR; mild AVR & AV mildly sclerotic    FAMHx:  Family History  Problem Relation Age of Onset  . Cancer Mother   . Alzheimer's disease Mother   . Lung cancer Father   . Cancer Maternal Grandmother   . Heart attack Maternal Grandfather   . Liver disease Brother        transplant  . Thyroid disease Child        thyroidectomy    SOCHx:   reports that she quit smoking about 49 years ago. Her smoking use included Cigarettes. She has a 20.00 pack-year smoking history. She has never used smokeless tobacco. Her alcohol and drug histories are not on file.  ALLERGIES:  No Known Allergies  ROS: Pertinent items noted in HPI and remainder of comprehensive ROS otherwise negative.  HOME MEDS: Current Outpatient Prescriptions  Medication Sig Dispense Refill  . aspirin EC 81 MG tablet Take 81 mg by mouth daily.    . calcium citrate (CALCITRATE - DOSED IN MG ELEMENTAL CALCIUM) 950 MG tablet Take 1 tablet by mouth daily.    . empagliflozin (JARDIANCE) 25 MG TABS tablet Take 25 mg by mouth daily.    . enalapril (VASOTEC) 20 MG tablet Take 20 mg by mouth daily.    Marland Kitchen LANTUS SOLOSTAR 100 UNIT/ML Solostar Pen Inject 12 Units into the skin at bedtime.   0  . magnesium oxide (MAG-OX) 400 MG tablet Take 400 mg by mouth daily.    . metFORMIN (GLUCOPHAGE-XR) 500 MG 24 hr tablet Take 1 tablet (500 mg total) by mouth 2 (two) times daily.    . metoprolol succinate (TOPROL-XL) 100 MG 24 hr tablet Take 50 mg by mouth daily.     . Multiple Vitamin (MULTIVITAMIN) capsule Take 1 capsule by mouth daily.    . Omega-3 Fatty Acids (FISH OIL) 1000 MG CAPS Take 1 capsule by mouth daily.     . pravastatin  (PRAVACHOL) 40 MG tablet Take 1 tablet by mouth daily.     No current facility-administered medications for this visit.     LABS/IMAGING: No results found for this or any previous visit (from the past 48 hour(s)). No results found.  VITALS: BP (!) 124/56   Pulse 62   Ht 5\' 6"  (1.676 m)   Wt 182 lb 9.6 oz (82.8 kg)   BMI 29.47 kg/m   EXAM: General appearance: alert and no distress Neck: no carotid bruit, no JVD and thyroid not enlarged, symmetric, no tenderness/mass/nodules Lungs: clear to auscultation bilaterally Heart: regular rate and rhythm, S1, S2 normal, no murmur, click, rub or gallop Abdomen: soft, non-tender; bowel sounds normal; no masses,  no organomegaly Extremities: extremities normal, atraumatic, no cyanosis or edema and varicose veins noted Pulses: 2+ and symmetric Skin: Skin color, texture, turgor normal. No rashes or lesions Neurologic: Grossly normal Psych: Pleasant  EKG: Sinus rhythm with first-degree AV  block at 62-personally reviewed  ASSESSMENT: 1. Palpitations-controlled 2. Hypertension-at goal on enalapril and metoprolol 3. Dyslipidemia-On pravastatin 4. 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   Rachel Wong continues to do well. A recent lipid profile showed excellent control of her cholesterol which is at goal with LDL less than 70. She has well-controlled hypertension. Recently started exercising and is feeling better. She denies any recurrent palpitations. Her diabetes is also been well controlled. Plan to see her back in 6 months we'll repeat a lipid profile that time.  Chrystie NoseKenneth C. Naleyah Ohlinger, MD, [facility] Attending Cardiologist [facility]  Lisette AbuKenneth C Jeffie Widdowson 12/31/2016, 8:22 AM

## 2017-08-10 ENCOUNTER — Ambulatory Visit: Payer: Medicare Other | Admitting: Internal Medicine

## 2017-09-24 ENCOUNTER — Encounter (HOSPITAL_BASED_OUTPATIENT_CLINIC_OR_DEPARTMENT_OTHER): Payer: Self-pay

## 2017-09-24 ENCOUNTER — Emergency Department (HOSPITAL_BASED_OUTPATIENT_CLINIC_OR_DEPARTMENT_OTHER)
Admission: EM | Admit: 2017-09-24 | Discharge: 2017-09-24 | Disposition: A | Discharge status: Home / Self Care | Payer: Medicare Other | Attending: Emergency Medicine | Admitting: Emergency Medicine

## 2017-09-24 ENCOUNTER — Emergency Department (HOSPITAL_BASED_OUTPATIENT_CLINIC_OR_DEPARTMENT_OTHER): Payer: Medicare Other

## 2017-09-24 DIAGNOSIS — Z9049 Acquired absence of other specified parts of digestive tract: Secondary | ICD-10-CM | POA: Diagnosis not present

## 2017-09-24 DIAGNOSIS — I10 Essential (primary) hypertension: Secondary | ICD-10-CM | POA: Insufficient documentation

## 2017-09-24 DIAGNOSIS — W19XXXA Unspecified fall, initial encounter: Secondary | ICD-10-CM

## 2017-09-24 DIAGNOSIS — Z87891 Personal history of nicotine dependence: Secondary | ICD-10-CM | POA: Insufficient documentation

## 2017-09-24 DIAGNOSIS — M25552 Pain in left hip: Secondary | ICD-10-CM | POA: Diagnosis present

## 2017-09-24 DIAGNOSIS — Z794 Long term (current) use of insulin: Secondary | ICD-10-CM | POA: Insufficient documentation

## 2017-09-24 DIAGNOSIS — W0110XA Fall on same level from slipping, tripping and stumbling with subsequent striking against unspecified object, initial encounter: Secondary | ICD-10-CM | POA: Diagnosis not present

## 2017-09-24 DIAGNOSIS — E119 Type 2 diabetes mellitus without complications: Secondary | ICD-10-CM | POA: Insufficient documentation

## 2017-09-24 DIAGNOSIS — Z7982 Long term (current) use of aspirin: Secondary | ICD-10-CM | POA: Diagnosis not present

## 2017-09-24 DIAGNOSIS — Z79899 Other long term (current) drug therapy: Secondary | ICD-10-CM | POA: Diagnosis not present

## 2017-09-24 NOTE — Discharge Instructions (Signed)
Your x-ray today did not show evidence of fracture or dislocation.  The rest of your exam was reassuring.  We did not feel he needed further imaging.  Please follow-up with your primary doctor.  If any symptoms change or worsen or the swelling worsens, please return to the nearest emergency department.

## 2017-09-24 NOTE — ED Notes (Signed)
Patient stated that her personal trainer said that she has a lump to her left hip but unable to assess while she is lying down.

## 2017-09-24 NOTE — ED Notes (Signed)
ED Provider at bedside. 

## 2017-09-24 NOTE — ED Triage Notes (Signed)
Pt states slid on a plastic bag at church, landed on rt knee and twisted lt hip

## 2017-09-24 NOTE — ED Provider Notes (Signed)
[facility] EMERGENCY DEPARTMENT Provider Note   CSN: [id number] Arrival date & time: 09/24/17  1234     History   Chief Complaint Chief Complaint  Patient presents with  . Fall    HPI Rachel Wong is a 77 y.o. female.  The history is provided by the patient and medical records.  Fall  This is a new problem. The current episode started yesterday. The problem occurs constantly. The problem has not changed since onset.Pertinent negatives include no chest pain, no abdominal pain, no headaches and no shortness of breath. The symptoms are aggravated by twisting. Nothing relieves the symptoms. She has tried nothing for the symptoms. The treatment provided no relief.    Past Medical History:  Diagnosis Date  . Choledocholithiasis 04/2016  . Complication of anesthesia   . Diabetes (HCC)   . 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 History of nuclear stress test 2006   adenosine; normal perfusion, no fixed or reversible defects  . History of palpitations   . Hyperlipidemia   . Hypertension   . PONV (postoperative nausea and vomiting)   . PVD (peripheral vascular disease) [facility](HCC)     Patient Active Problem List   Diagnosis Date Noted  . Choledocholithiasis   . Atrial fibrillation with rapid ventricular response (HCC)   . Pancreatitis due to common bile duct stone   . Uncontrolled type 2 diabetes mellitus with complication (HCC)   . Choledocholithiasis with obstruction 04/27/2016  . Atrial fibrillation (HCC) 04/27/2016  . Palpitations 03/16/2013  . Essential hypertension 03/16/2013  . DM2 (diabetes mellitus, type 2) (HCC) 03/16/2013  . Hyperlipidemia 03/16/2013    Past Surgical History:  Procedure Laterality Date  . CARDIAC CATHETERIZATION  05/2000   r/t false-positive stress test; normal coronaries - Dr. Mervyn SkeetersA. Little   . CHOLECYSTECTOMY N/A 05/02/2016   Procedure: LAPAROSCOPIC CHOLECYSTECTOMY WITH INTRAOPERATIVE CHOLANGIOGRAM;  Surgeon: Violeta GelinasBurke Thompson, MD;   [facility];  Service: General;  Laterality: N/A;  . ERCP N/A 04/29/2016   Procedure: ENDOSCOPIC RETROGRADE CHOLANGIOPANCREATOGRAPHY (ERCP);  Surgeon: Dorena CookeyJohn Hayes, MD;  [facility];  Service: Endoscopy;  Laterality: N/A;  . KNEE ARTHROSCOPY Right 10/2001  . ROTATOR CUFF REPAIR  1999  . TOE SURGERY Right 04/2003  . TRANSTHORACIC ECHOCARDIOGRAM  05/2010   EF=>55%, stage 1 diastolic dysfunction; mild MR; mild AVR & AV mildly sclerotic     OB History   None      Home Medications    Prior to Admission medications   Medication Sig Start Date End Date Taking? Authorizing Provider  aspirin EC 81 MG tablet Take 81 mg by mouth daily.    [provider]  calcium citrate (CALCITRATE - DOSED IN MG ELEMENTAL CALCIUM) 950 MG tablet Take 1 tablet by mouth daily.    [provider]  empagliflozin (JARDIANCE) 25 MG TABS tablet Take 25 mg by mouth daily. 08/13/16   [provider]  enalapril (VASOTEC) 20 MG tablet Take 20 mg by mouth daily. 02/14/13   [provider]  LANTUS SOLOSTAR 100 UNIT/ML Solostar Pen Inject 12 Units into the skin at bedtime.  05/17/16   [provider]  magnesium oxide (MAG-OX) 400 MG tablet Take 400 mg by mouth daily.    [provider]  metFORMIN (GLUCOPHAGE-XR) 500 MG 24 hr tablet Take 1 tablet (500 mg total) by mouth 2 (two) times daily. 05/04/16   Johnson, Clanford L, MD  metoprolol succinate (TOPROL-XL) 100 MG 24 hr tablet Take 50  mg by mouth daily.  01/23/13   [provider]  Multiple Vitamin (MULTIVITAMIN) capsule Take 1 capsule by mouth daily.    [provider]  Omega-3 Fatty Acids (FISH OIL) 1000 MG CAPS Take 1 capsule by mouth daily.     [provider]  pravastatin (PRAVACHOL) 40 MG tablet Take 1 tablet by mouth daily. 01/13/13   [provider]    Family History Family History  Problem Relation Age of Onset  . Cancer Mother   . Alzheimer's disease Mother   . Lung  cancer Father   . Cancer Maternal Grandmother   . Heart attack Maternal Grandfather   . Liver disease Brother        transplant  . Thyroid disease Child        thyroidectomy    Social History Social History   Tobacco Use  . Smoking status: Former Smoker    Packs/day: 1.00    Years: 20.00    Pack years: 20.00    Types: Cigarettes    Last attempt to quit: 03/17/1967    Years since quitting: 50.5  . Smokeless tobacco: Never Used  . Tobacco comment: quit smoking in late 1960s  Substance Use Topics  . Alcohol use: Not on file  . Drug use: Not on file     Allergies   Patient has no known allergies.   Review of Systems Review of Systems  Constitutional: Negative for chills and fever.  HENT: Negative for congestion, ear pain and sore throat.   Eyes: Negative for pain and visual disturbance.  Respiratory: Negative for cough, chest tightness and shortness of breath.   Cardiovascular: Negative for chest pain and palpitations.  Gastrointestinal: Negative for abdominal pain and vomiting.  Genitourinary: Negative for dysuria, flank pain, frequency and hematuria.  Musculoskeletal: Negative for arthralgias, back pain, neck pain and neck stiffness.  Skin: Positive for color change (bruises). Negative for rash and wound.  Neurological: Negative for seizures, syncope, light-headedness and headaches.  Psychiatric/Behavioral: Negative for agitation.  All other systems reviewed and are negative.    Physical Exam Updated Vital Signs BP (!) 142/111 (BP Location: Left Arm)   Pulse 72   Temp 98.1 F (36.7 C) (Oral)   Resp 18   SpO2 97%   Physical Exam  Constitutional: She is oriented to person, place, and time. She appears well-developed and well-nourished. No distress.  HENT:  Head: Normocephalic.  Nose: Nose normal.  Mouth/Throat: Oropharynx is clear and moist. No oropharyngeal exudate.  Eyes: Pupils are equal, round, and reactive to light. Conjunctivae and EOM are normal.    Neck: Neck supple.  Cardiovascular: Normal rate and intact distal pulses.  No murmur heard. Pulmonary/Chest: No respiratory distress. She has no wheezes. She exhibits no tenderness.  Abdominal: Soft. She exhibits no distension. There is no tenderness. There is no guarding.  Musculoskeletal: She exhibits tenderness. She exhibits no edema.       Left hip: She exhibits tenderness.       Right knee: She exhibits ecchymosis. No tenderness found.       Legs: Neurological: She is alert and oriented to person, place, and time. No sensory deficit. She exhibits normal muscle tone.  Skin: Skin is warm. Capillary refill takes less than 2 seconds. No rash noted. She is not diaphoretic. No erythema.  Psychiatric: She has a normal mood and affect.  Nursing note and vitals reviewed.    ED Treatments / Results  Labs (all labs ordered are listed, but  only abnormal results are displayed) Labs Reviewed - No data to display  EKG None  Radiology Dg Hip Unilat W Or Wo Pelvis 2-3 Views Left  Result Date: 09/24/2017 CLINICAL DATA:  Fall.  Left hip pain EXAM: DG HIP (WITH OR WITHOUT PELVIS) 2-3V LEFT COMPARISON:  None. FINDINGS: There is no evidence of hip fracture or dislocation. There is no evidence of arthropathy or other focal bone abnormality Lumbar spine disc degeneration at L4-5 on the right. Early arterial calcification. IMPRESSION: Negative for fracture. Electronically Signed   By: Marlan Palau M.D.   On: 09/24/2017 15:07    Procedures Procedures (including critical care time)  Medications Ordered in ED Medications - No data to display   Initial Impression / Assessment and Plan / ED Course  I have reviewed the triage vital signs and the nursing notes.  Pertinent labs & imaging results that were available during my care of the patient were reviewed by me and considered in my medical decision making (see chart for details).     Rachel Wong is a 77 y.o. female with past medical history  significant for hypertension, hyperlipidemia, and diabetes who presents with a fall with left hip pain and bruising on the right knee.  Patient reports that she was at church last night and slipped on a trash bag.  She reports that she fell and struck her right knee on the ground causing a bruise and then had pain start in her left hip.  She reports she is still able to ambulate.  She reports that the pain is mild to moderate and when she was seeing her physical trainer today, he recommended she come to the emergency department for evaluation given the knot that was palpated on the left hip area.  Patient reports she took over-the-counter pain medication with some improvement in the discomfort.  She denies any preceding symptoms and it was a mechanical fall.  She denies any head injury, neck pain, chest pain, back pain or abdominal pain.  She denies any pain in her knees or legs.  She reports no numbness, tingling, or weakness.  On exam, patient is bruising on the right knee.  It was nontender.  Patient reports she does not want x-ray of the right knee.  Patient's left hip was manipulated without difficulty but she does have some tenderness in the left lateral hip.  No bruising was seen.  No knots were found.  Patient agreed to get a x-ray of the left hip given the tenderness.  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  Patient otherwise appears well and did not want any medications.  X-ray obtained showing no evidence of fracture dislocation.  Suspect soft tissue injury.    Patient agreed with plan for PCP follow-up and conservative management.  Patient understood return precautions.  Patient had no depressions or concerns and was discharged in good condition.   Final Clinical Impressions(s) / ED Diagnoses   Final diagnoses:  Fall, initial encounter  Left hip pain    ED Discharge Orders    None     Clinical Impression: 1. Fall,  initial encounter   2. Left hip pain     Disposition: Discharge  Condition: Good  I have discussed the results, Dx and Tx plan with the pt(& family if present). He/she/they expressed understanding and agree(s) with the plan. Discharge instructions discussed at great length. Strict return precautions discussed and pt &/or family  have verbalized understanding of the instructions. No further questions at time of discharge.    New Prescriptions   No medications on file    Follow Up: No follow-up provider specified.    Rilee Knoll, Canary Brim, MD 09/24/17 1540

## 2017-10-06 ENCOUNTER — Ambulatory Visit: Payer: Medicare Other | Admitting: Internal Medicine

## 2017-10-06 ENCOUNTER — Encounter: Payer: Self-pay | Admitting: Internal Medicine

## 2017-10-06 VITALS — BP 126/60 | HR 73 | Ht 66.0 in | Wt 178.4 lb

## 2017-10-06 DIAGNOSIS — R002 Palpitations: Secondary | ICD-10-CM | POA: Diagnosis not present

## 2017-10-06 DIAGNOSIS — E7800 Pure hypercholesterolemia, unspecified: Secondary | ICD-10-CM | POA: Insufficient documentation

## 2017-10-06 DIAGNOSIS — I10 Essential (primary) hypertension: Secondary | ICD-10-CM | POA: Diagnosis not present

## 2017-10-06 NOTE — Patient Instructions (Signed)
Your physician wants you to follow-up in: ONE YEAR with Dr. Hilty. You will receive a reminder letter in the mail two months in advance. If you don't receive a letter, please call our office to schedule the follow-up appointment.  

## 2017-10-06 NOTE — Progress Notes (Signed)
OFFICE NOTE  Chief Complaint:  No issues  Primary Care Physician: Raynelle Jan., MD  HPI:  Rachel Wong  is a 77 year old female previously followed by Dr. Clarene Duke with history of palpitations improved with electrolyte replacement and adjustments in her beta blocker. She also has hypertension which has been well controlled, hyperlipidemia and diabetes. She reports being fairly stable. I reviewed results from her primary care provider's office. She recently had a cholesterol check which showed total cholesterol to be 158. Her triglycerides were slightly elevated and she is continuing to work on her diet. She has lost several pounds and I think this will be helpful for her. With her and her blood glucose control, her hemoglobin A1c is 5.8 and has been stable at that level. She denies any further palpitations. She occasionally gets cramps in her left leg and does have varicose veins. There is a slight amount of swelling and she previously had left knee surgery. Other than those few episodes, she is really asymptomatic.  Rachel Wong returns today for follow-up. She denies any palpitations. She reports that as long she takes her magnesium she seems to be doing fairly well. She is talking about possibly retiring in December. 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 Her blood pressure is well controlled. She reports her A1c is at 6 which represents good control.  Rachel Wong returns today for follow-up. She is reportedly asymptomatic, denying any palpitations, chest pain or shortness of breath. She has mildly low blood pressure today although reports a generally is between 120 and 140 systolic. Her EKG shows normal sinus rhythm without any ischemic changes. She occasionally gets some cramping in her legs and her hands. She is takes magnesium oxide but this is not remedy the problem. After talking to her daughter she is convinced this is related to pravastatin. She's been on this for  some time with good cholesterol control.  06/15/2016  Rachel Wong was seen today in follow-up. I recently saw her when she was hospitalized for acute gallstone pancreatitis. There was a brief episode of atrial fibrillation however frequent PACs were noted. This did not meet candidacy for anticoagulation. She's had no recurrence. Since discharge she has done well. She denies a chest pain or worsening shortness of breath. She is restarted her cholesterol medication and is due for repeat lipid profile. She also saw her endocrinologist who started her on Jardiance.   12/31/2016  Rachel Wong returns today for follow-up. Recently she started doing Silver sneakers when a friend told her about it and she found out it was free. She now exercises about a few times a week and uses a Systems analyst. She says she is moving a lot better and has less joint pain. She is less short of breath. She denies any chest pain or recurrent palpitations. Blood pressure is well-controlled today. Her cholesterol is well-controlled in January total cholesterol 139, HDL-C 45, LDL-C 66 and triglycerides 139.  10/06/2017  Rachel Wong returns today for follow-up.  She is done well over the past year.  She denies chest pain or worsening shortness of breath.  Blood pressure is well controlled.  EKG is stable showing sinus rhythm.  Blood sugars have been fairly well controlled on metformin and Jardiance.  She takes low-dose pravastatin.  She is compliant with daily aspirin.  PMHx:  Past Medical History:  Diagnosis Date  . Choledocholithiasis 04/2016  . Complication of anesthesia   . Diabetes (HCC)   . 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 History of nuclear stress test 2006   adenosine; normal perfusion, no fixed or reversible defects  . History of palpitations   . Hyperlipidemia   . Hypertension   . PONV (postoperative nausea and vomiting)   . PVD (peripheral vascular disease) (HCC)     Past Surgical History:  Procedure  Laterality Date  . CARDIAC CATHETERIZATION  05/2000   r/t false-positive stress test; normal coronaries - Dr. Mervyn Skeeters. [place]   . CHOLECYSTECTOMY N/A 05/02/2016   Procedure: LAPAROSCOPIC CHOLECYSTECTOMY WITH INTRAOPERATIVE CHOLANGIOGRAM;  Surgeon: Violeta Gelinas, MD;  Location: [facility] OR;  Service: General;  Laterality: N/A;  . ERCP N/A 04/29/2016   Procedure: ENDOSCOPIC RETROGRADE CHOLANGIOPANCREATOGRAPHY (ERCP);  Surgeon: Dorena Cookey, MD;  Location: [facility] ENDOSCOPY;  Service: Endoscopy;  Laterality: N/A;  . KNEE ARTHROSCOPY Right 10/2001  . ROTATOR CUFF REPAIR  1999  . TOE SURGERY Right 04/2003  . TRANSTHORACIC ECHOCARDIOGRAM  05/2010   EF=>55%, stage 1 diastolic dysfunction; mild MR; mild AVR & AV mildly sclerotic    FAMHx:  Family History  Problem Relation Age of Onset  . Cancer Mother   . Alzheimer's disease Mother   . Lung cancer Father   . Cancer Maternal Grandmother   . Heart attack Maternal Grandfather   . Liver disease Brother        transplant  . Thyroid disease Child        thyroidectomy    SOCHx:   reports that she quit smoking about 50 years ago. Her smoking use included cigarettes. She has a 20.00 pack-year smoking history. She has never used smokeless tobacco. Her alcohol and drug histories are not on file.  ALLERGIES:  No Known Allergies  ROS: Pertinent items noted in HPI and remainder of comprehensive ROS otherwise negative.  HOME MEDS: Current Outpatient Medications  Medication Sig Dispense Refill  . aspirin EC 81 MG tablet Take 81 mg by mouth daily.    . calcium citrate (CALCITRATE - DOSED IN MG ELEMENTAL CALCIUM) 950 MG tablet Take 1 tablet by mouth daily.    . empagliflozin (JARDIANCE) 25 MG TABS tablet Take 25 mg by mouth daily.    . enalapril (VASOTEC) 20 MG tablet Take 20 mg by mouth daily.    Marland Kitchen LANTUS SOLOSTAR 100 UNIT/ML Solostar Pen Inject 10 Units into the skin at bedtime.   0  . magnesium oxide (MAG-OX) 400 MG tablet Take 400 mg by mouth daily.    .  metFORMIN (GLUCOPHAGE) 500 MG tablet Take 1,000 mg by mouth 2 (two) times daily with a meal.    . metoprolol succinate (TOPROL-XL) 100 MG 24 hr tablet Take 50 mg by mouth daily.     . Multiple Vitamin (MULTIVITAMIN) capsule Take 1 capsule by mouth daily.    . Omega-3 Fatty Acids (FISH OIL) 1000 MG CAPS Take 1 capsule by mouth daily.     . pravastatin (PRAVACHOL) 40 MG tablet Take 1 tablet by mouth daily.     No current facility-administered medications for this visit.     LABS/IMAGING: No results found for this or any previous visit (from the past 48 hour(s)). No results found.  VITALS: BP 126/60   Pulse 73   Ht  (1.676 m)   Wt 178 lb 6.4 oz (80.9 kg)   BMI 28.79 kg/m   EXAM: General appearance: alert and no distress Neck: no carotid bruit, no JVD and thyroid not enlarged, symmetric, no tenderness/mass/nodules Lungs: clear to auscultation bilaterally  Heart: regular rate and rhythm, S1, S2 normal, no murmur, click, rub or gallop Abdomen: soft, non-tender; bowel sounds normal; no masses,  no organomegaly Extremities: extremities normal, atraumatic, no cyanosis or edema and varicose veins noted Pulses: 2+ and symmetric Skin: Skin color, texture, turgor normal. No rashes or lesions Neurologic: Grossly normal Psych: Pleasant  EKG: Sinus rhythm with first-degree AV block at 73-personally reviewed  ASSESSMENT: 1. Palpitations-controlled 2. Hypertension-at goal on enalapril and metoprolol 3. Dyslipidemia-On pravastatin 4. 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   Rachel Wong tends to do well without any significant palpitations.  Her blood pressure is at goal. Cholesterol has been well controlled.  And her diabetes is good.  Overall she is doing well for age and I recommend no changes to her medicines today.  We will see her back annually or sooner as necessary.  Chrystie Nose, MD, [facility], FACP  [place]  [facility]  Medical Director of  the Advanced Lipid Disorders &  Cardiovascular Risk Reduction Clinic Diplomate of the American Board of Clinical Lipidology Attending Cardiologist  Direct Dial: [phone]  Fax: [phone]  Website:  [email or web address] 10/06/2017, 10:24 AM

## 2018-05-07 IMAGING — RF DG ERCP WO/W SPHINCTEROTOMY
1 series · 2 of 2 positions shown · non-contrast
Comparison: None.

CLINICAL DATA: Palpitations

EXAM:
ERCP
TECHNIQUE: Multiple spot images obtained with the fluoroscopic device and
submitted for interpretation post-procedure.
FLUOROSCOPY TIME:  Fluoroscopy Time:  1 minutes and 53 seconds
Radiation Exposure Index (if provided by the fluoroscopic device):
Number of Acquired Spot Images: 2

[Series 1: run · 2 of 2 slices shown]
[im 1/2]
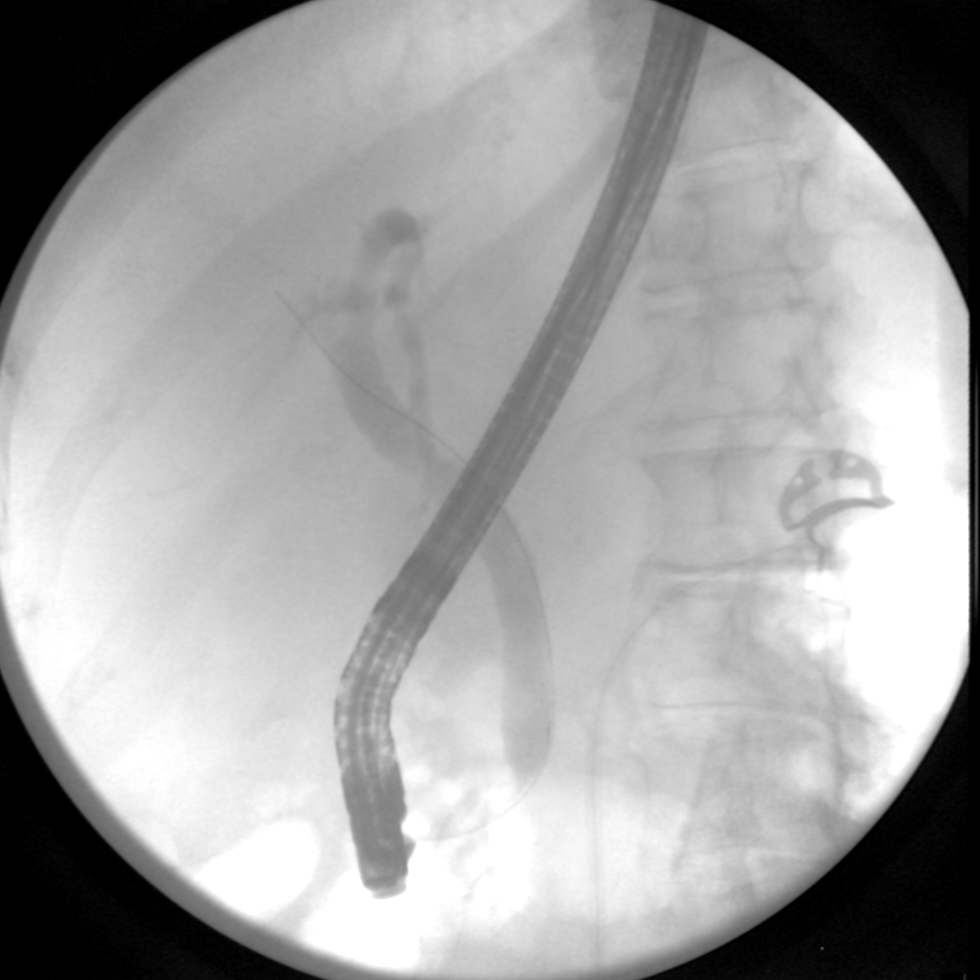
[im 2/2]
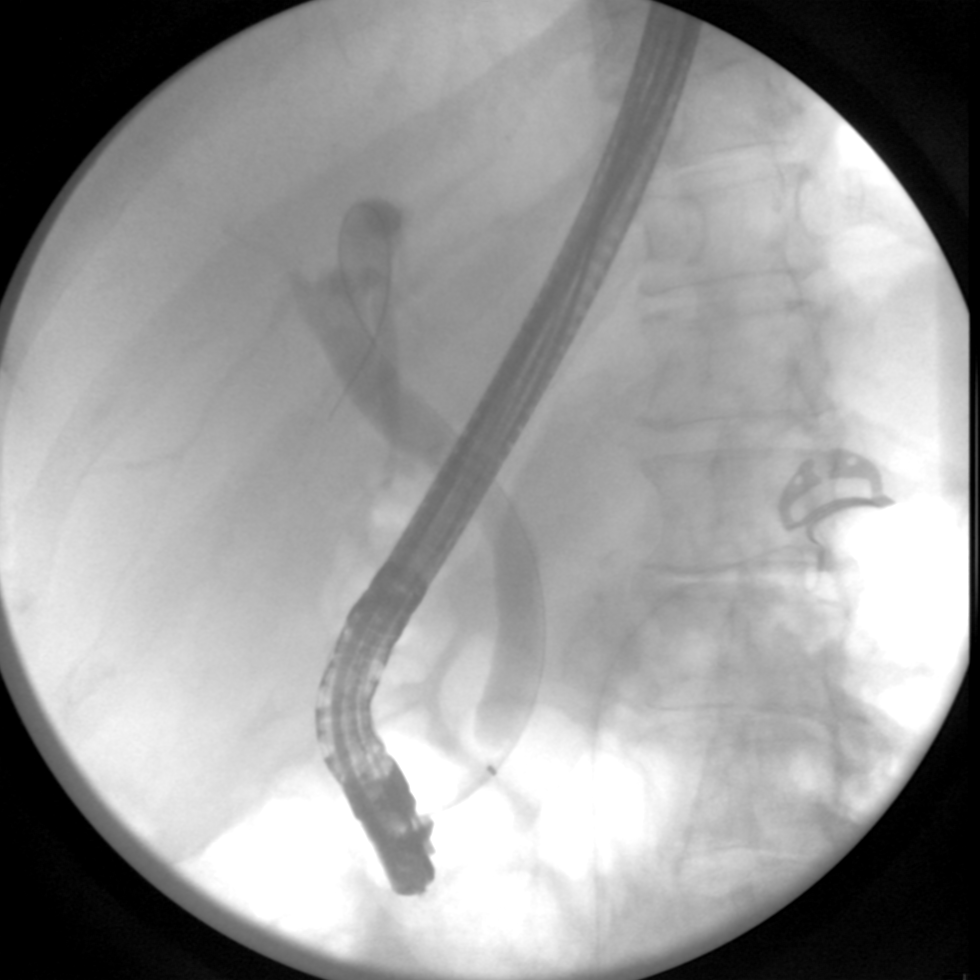

[2 of 2 positions shown; findings below may reference images not displayed]

FINDINGS: Contrast fills the biliary tree and duodenum there are no filling
defects in the common bile duct. The common bile duct is mildly
dilated.
IMPRESSION: See above.

These images were submitted for radiologic interpretation only.
Please see the procedural report for the amount of contrast and the
fluoroscopy time utilized.

## 2019-04-11 ENCOUNTER — Other Ambulatory Visit: Payer: Self-pay

## 2019-04-11 ENCOUNTER — Encounter: Payer: Self-pay | Admitting: Internal Medicine

## 2019-04-11 ENCOUNTER — Ambulatory Visit: Payer: Medicare Other | Admitting: Internal Medicine

## 2019-04-11 VITALS — BP 127/64 | HR 75 | Ht 66.0 in | Wt 174.2 lb

## 2019-04-11 DIAGNOSIS — E7800 Pure hypercholesterolemia, unspecified: Secondary | ICD-10-CM | POA: Diagnosis not present

## 2019-04-11 DIAGNOSIS — R002 Palpitations: Secondary | ICD-10-CM | POA: Diagnosis not present

## 2019-04-11 DIAGNOSIS — I10 Essential (primary) hypertension: Secondary | ICD-10-CM | POA: Diagnosis not present

## 2019-04-11 NOTE — Progress Notes (Signed)
OFFICE NOTE  Chief Complaint:  No issues  Primary Care Physician: Raynelle JanSpry, Heather M., MD  HPI:  Rachel Wong  is a 78 year old female previously followed by Dr. Clarene DukeLittle with history of palpitations improved with electrolyte replacement and adjustments in her beta blocker. She also has hypertension which has been well controlled, hyperlipidemia and diabetes. She reports being fairly stable. I reviewed results from her primary care provider's office. She recently had a cholesterol check which showed total cholesterol to be 158. Her triglycerides were slightly elevated and she is continuing to work on her diet. She has lost several pounds and I think this will be helpful for her. With her and her blood glucose control, her hemoglobin A1c is 5.8 and has been stable at that level. She denies any further palpitations. She occasionally gets cramps in her left leg and does have varicose veins. There is a slight amount of swelling and she previously had left knee surgery. Other than those few episodes, she is really asymptomatic.  Rachel Wong returns today for follow-up. She denies any palpitations. She reports that as long she takes her magnesium she seems to be doing fairly well. She is talking about possibly retiring in December. 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 Her blood pressure is well controlled. She reports her A1c is at 6 which represents good control.  Rachel Wong returns today for follow-up. She is reportedly asymptomatic, denying any palpitations, chest pain or shortness of breath. She has mildly low blood pressure today although reports a generally is between 120 and 140 systolic. Her EKG shows normal sinus rhythm without any ischemic changes. She occasionally gets some cramping in her legs and her hands. She is takes magnesium oxide but this is not remedy the problem. After talking to her daughter she is convinced this is related to pravastatin. She's been on this for  some time with good cholesterol control.  06/15/2016  Rachel Wong was seen today in follow-up. I recently saw her when she was hospitalized for acute gallstone pancreatitis. There was a brief episode of atrial fibrillation however frequent PACs were noted. This did not meet candidacy for anticoagulation. She's had no recurrence. Since discharge she has done well. She denies a chest pain or worsening shortness of breath. She is restarted her cholesterol medication and is due for repeat lipid profile. She also saw her endocrinologist who started her on Jardiance.   12/31/2016  Rachel Wong returns today for follow-up. Recently she started doing Silver sneakers when a friend told her about it and she found out it was free. She now exercises about a few times a week and uses a Systems analystpersonal trainer. She says she is moving a lot better and has less joint pain. She is less short of breath. She denies any chest pain or recurrent palpitations. Blood pressure is well-controlled today. Her cholesterol is well-controlled in January total cholesterol 139, HDL-C 45, LDL-C 66 and triglycerides 139.  10/06/2017  Rachel Wong returns today for follow-up.  She is done well over the past year.  She denies chest pain or worsening shortness of breath.  Blood pressure is well controlled.  EKG is stable showing sinus rhythm.  Blood sugars have been fairly well controlled on metformin and Jardiance.  She takes low-dose pravastatin.  She is compliant with daily aspirin.  04/11/2019  Rachel Wong is seen today in follow-up.  Overall she continues to do well.  She recently saw her primary care provider and her endocrinologist.  Her hemoglobin A1c has gone up from 6.1-7.  She is noted some dietary changes.  She attributes this to spending more time with her new boyfriend for which she is doing a lot of cooking.  Cholesterol has been fairly well controlled.  She denies any chest pain.  Her blood pressure is at goal today.  PMHx:  Past Medical  History:  Diagnosis Date  . Choledocholithiasis 04/2016  . Complication of anesthesia   . Diabetes (HCC)   . 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 History of nuclear stress test 2006   adenosine; normal perfusion, no fixed or reversible defects  . History of palpitations   . Hyperlipidemia   . Hypertension   . PONV (postoperative nausea and vomiting)   . PVD (peripheral vascular disease) (HCC)     Past Surgical History:  Procedure Laterality Date  . CARDIAC CATHETERIZATION  05/2000   r/t false-positive stress test; normal coronaries - Dr. Mervyn Skeeters. [place]   . CHOLECYSTECTOMY N/A 05/02/2016   Procedure: LAPAROSCOPIC CHOLECYSTECTOMY WITH INTRAOPERATIVE CHOLANGIOGRAM;  Surgeon: Violeta Gelinas, MD;  Location: [facility] OR;  Service: General;  Laterality: N/A;  . ERCP N/A 04/29/2016   Procedure: ENDOSCOPIC RETROGRADE CHOLANGIOPANCREATOGRAPHY (ERCP);  Surgeon: Dorena Cookey, MD;  Location: [facility] ENDOSCOPY;  Service: Endoscopy;  Laterality: N/A;  . KNEE ARTHROSCOPY Right 10/2001  . ROTATOR CUFF REPAIR  1999  . TOE SURGERY Right 04/2003  . TRANSTHORACIC ECHOCARDIOGRAM  05/2010   EF=>55%, stage 1 diastolic dysfunction; mild MR; mild AVR & AV mildly sclerotic    FAMHx:  Family History  Problem Relation Age of Onset  . Cancer Mother   . Alzheimer's disease Mother   . Lung cancer Father   . Cancer Maternal Grandmother   . Heart attack Maternal Grandfather   . Liver disease Brother        transplant  . Thyroid disease Child        thyroidectomy    SOCHx:   reports that she quit smoking about 52 years ago. Her smoking use included cigarettes. She has a 20.00 pack-year smoking history. She has never used smokeless tobacco. No history on file for alcohol and drug.  ALLERGIES:  No Known Allergies  ROS: Pertinent items noted in HPI and remainder of comprehensive ROS otherwise negative.  HOME MEDS: Current Outpatient Medications  Medication Sig Dispense Refill  . aspirin EC 81 MG tablet Take 81  mg by mouth daily.    . calcium citrate (CALCITRATE - DOSED IN MG ELEMENTAL CALCIUM) 950 MG tablet Take 1 tablet by mouth daily.    . empagliflozin (JARDIANCE) 25 MG TABS tablet Take 25 mg by mouth daily.    . enalapril (VASOTEC) 20 MG tablet Take 20 mg by mouth daily.    . magnesium oxide (MAG-OX) 400 MG tablet Take 400 mg by mouth daily.    . metFORMIN (GLUCOPHAGE) 500 MG tablet Take 1,000 mg by mouth 2 (two) times daily with a meal.    . metoprolol succinate (TOPROL-XL) 100 MG 24 hr tablet Take 50 mg by mouth daily.     . Multiple Vitamin (MULTIVITAMIN) capsule Take 1 capsule by mouth daily.    . Multiple Vitamins-Minerals (PRESERVISION AREDS PO) Take 1 tablet by mouth 2 (two) times daily.    . Omega-3 Fatty Acids (FISH OIL) 1000 MG CAPS Take 1 capsule by mouth daily.     . pravastatin (PRAVACHOL) 40 MG tablet Take 1 tablet by mouth daily.     No current  facility-administered medications for this visit.     LABS/IMAGING: No results found for this or any previous visit (from the past 48 hour(s)). No results found.  VITALS: BP 127/64   Pulse 75   Ht 5\' 6"  (1.676 m)   Wt 174 lb 3.2 oz (79 kg)   SpO2 97%   BMI 28.12 kg/m   EXAM: General appearance: alert and no distress Neck: no carotid bruit, no JVD and thyroid not enlarged, symmetric, no tenderness/mass/nodules Lungs: clear to auscultation bilaterally Heart: regular rate and rhythm, S1, S2 normal, no murmur, click, rub or gallop Abdomen: soft, non-tender; bowel sounds normal; no masses,  no organomegaly Extremities: extremities normal, atraumatic, no cyanosis or edema and varicose veins noted Pulses: 2+ and symmetric Skin: Skin color, texture, turgor normal. No rashes or lesions Neurologic: Grossly normal Psych: Pleasant  EKG: Normal sinus rhythm at 75-personally reviewed  ASSESSMENT: 1. Palpitations-controlled 2. Hypertension-at goal on enalapril and metoprolol 3. Dyslipidemia-On pravastatin 4. IDDM -well controlled,  A1c 7 (on Jardiance and insulin)  PLAN: 1.   Mrs. Emminger reports good control of her palpitations.  Her blood pressure is well controlled and her cholesterol is at goal.  Her hemoglobin A1c is higher now at 7 and she will need to make dietary changes to accommodate for that.  She is under the care of endocrinology.  Plan follow-up with me annually or sooner as necessary.  Pixie Casino, MD, [facility], [place] Director of the Advanced Lipid Disorders &  Cardiovascular Risk Reduction Clinic Diplomate of the American Board of Clinical Lipidology Attending Cardiologist  Direct Dial: [phone]  Fax: [phone]  Website:  [email or web address] 04/11/2019, 1:33 PM

## 2019-04-11 NOTE — Patient Instructions (Signed)
Medication Instructions:  Your physician recommends that you continue on your current medications as directed. Please refer to the Current Medication list given to you today.  *If you need a refill on your cardiac medications before your next appointment, please call your pharmacy*  Follow-Up: At [facility], you and your health needs are our priority.  As part of our continuing mission to provide you with exceptional heart care, we have created designated Provider Care Teams.  These Care Teams include your primary Cardiologist (physician) and Advanced Practice Providers (APPs -  Physician Assistants and Nurse Practitioners) who all work together to provide you with the care you need, when you need it.  Your next appointment:   12 month(s)  The format for your next appointment:   Either In Person or Virtual  Provider:   You may see Dr. Hilty or one of the following Advanced Practice Providers on your designated Care Team:    Hao Meng, PA-C  Angela Duke, PA-C or   Krista Kroeger, PA-C   Other Instructions   

## 2019-10-02 IMAGING — CR DG HIP (WITH OR WITHOUT PELVIS) 2-3V*L*
3 series · 3 of 3 positions shown · non-contrast
Comparison: None.

CLINICAL DATA: Fall.  Left hip pain

EXAM:
DG HIP (WITH OR WITHOUT PELVIS) 2-3V LEFT

[t pelvis a.p.]
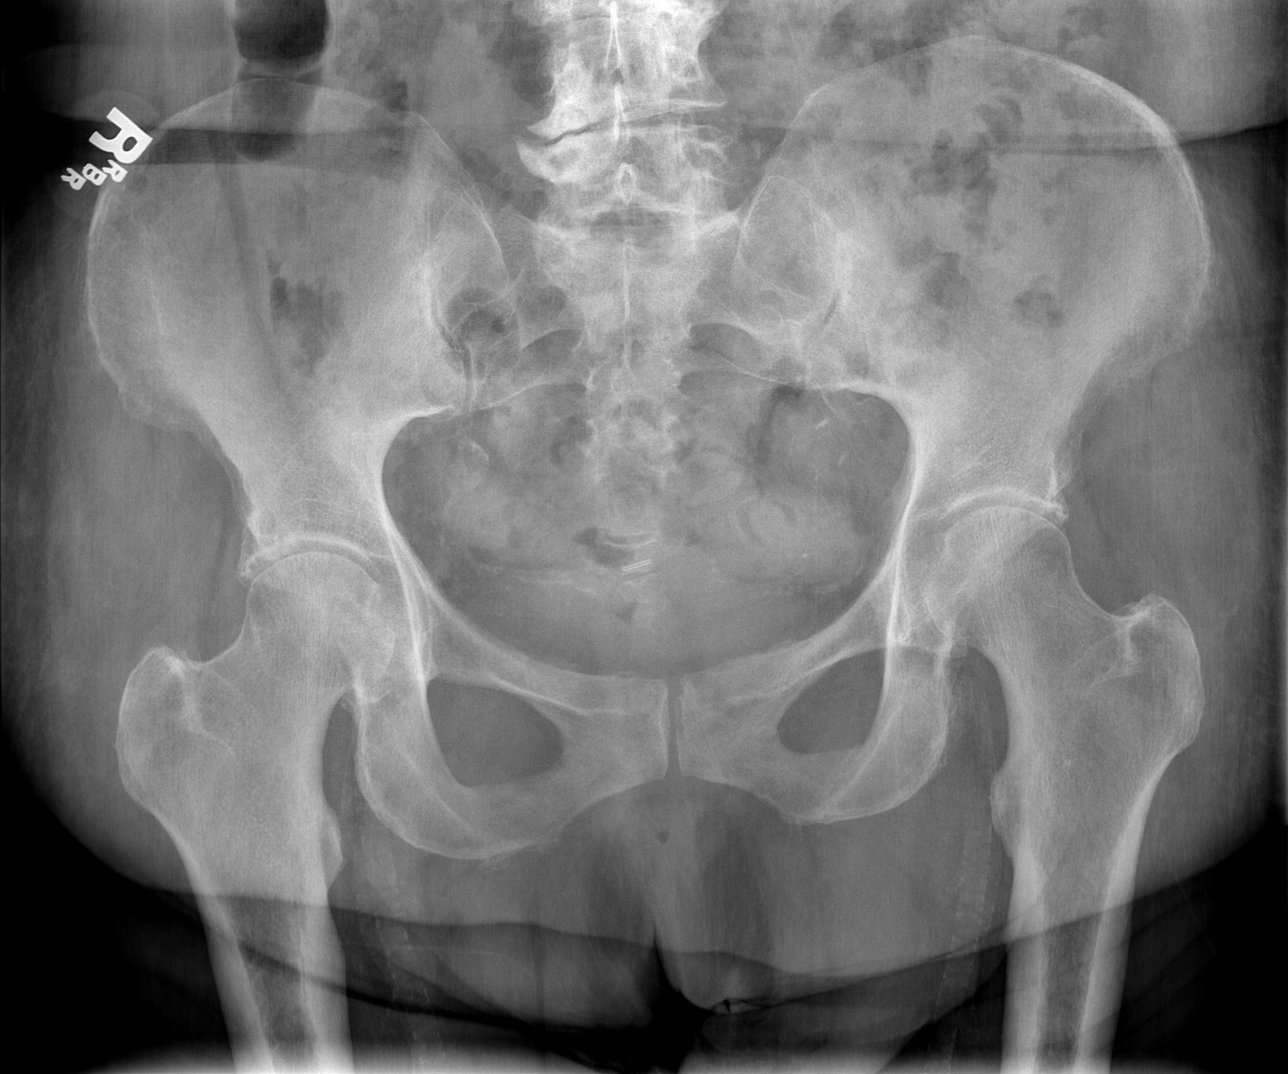

[t hip ap left]
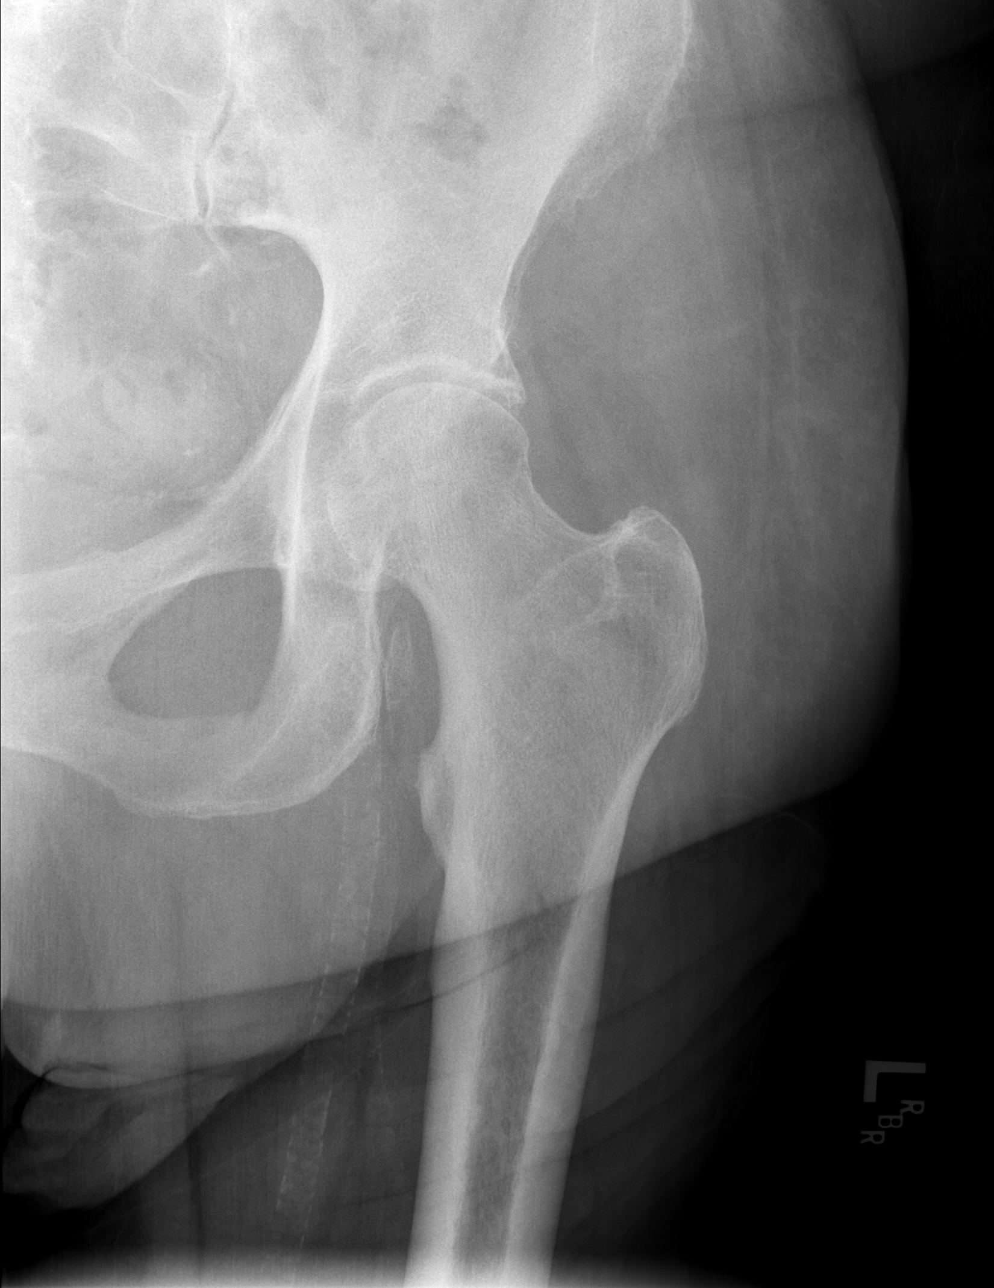

[t hip frog leg left]
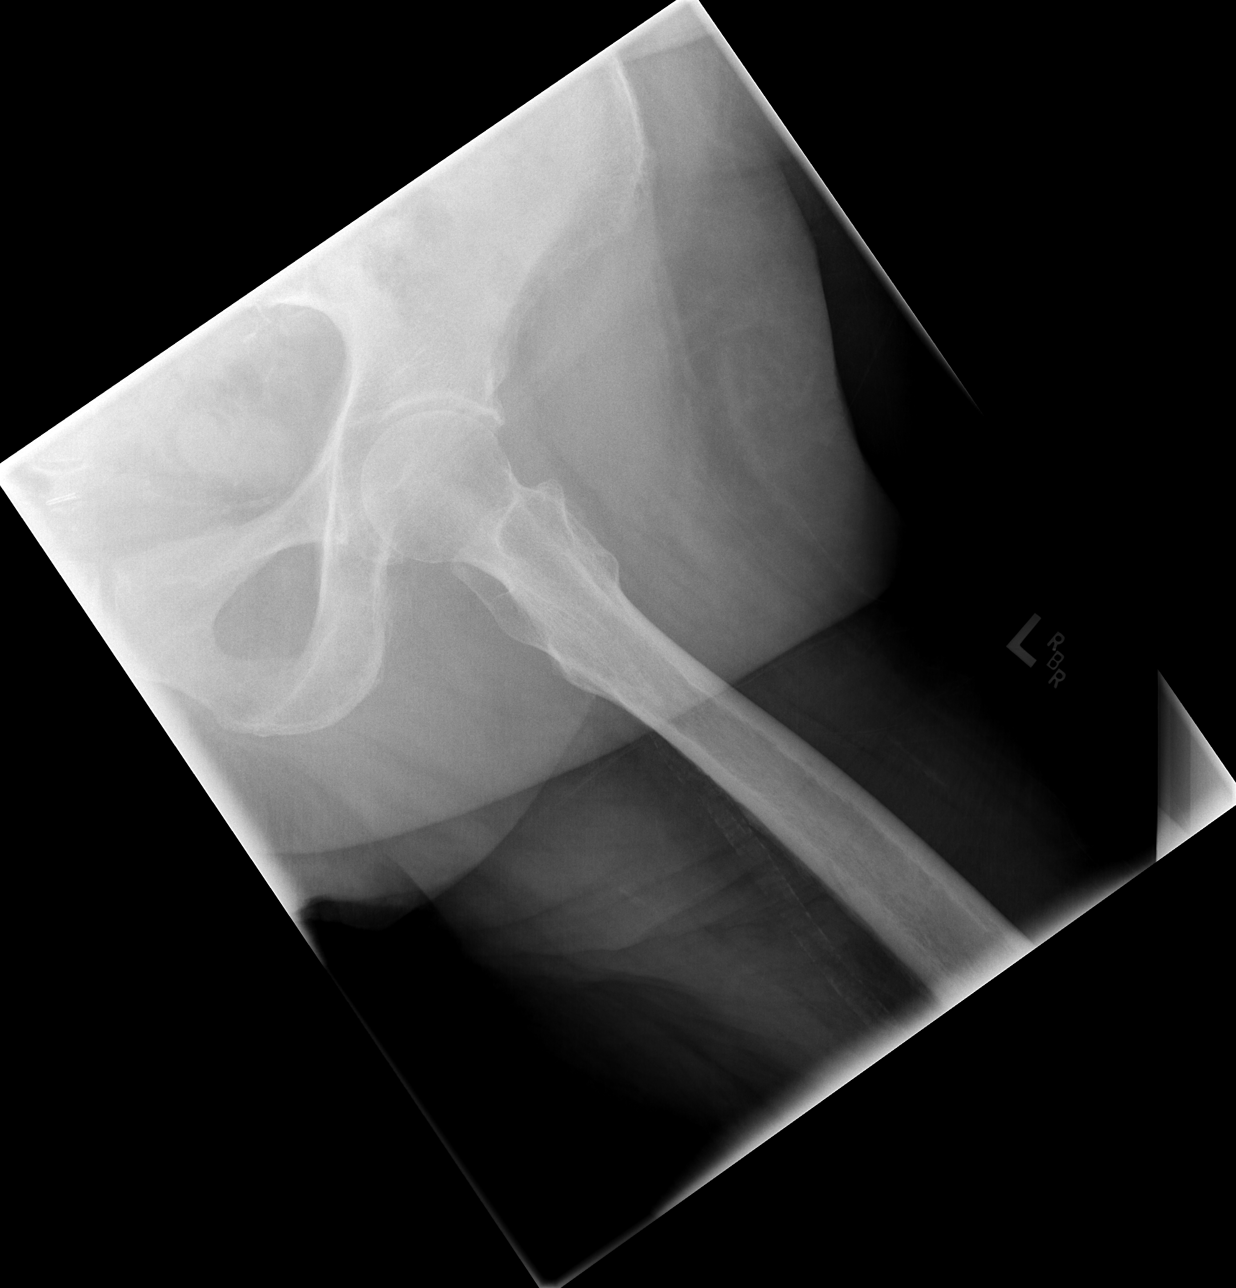

[3 of 3 positions shown; findings below may reference images not displayed]

FINDINGS: There is no evidence of hip fracture or dislocation. There is no
evidence of arthropathy or other focal bone abnormality

Lumbar spine disc degeneration at L4-5 on the right. Early arterial
calcification.
IMPRESSION: Negative for fracture.

## 2020-04-10 ENCOUNTER — Ambulatory Visit: Payer: Medicare Other | Admitting: Internal Medicine

## 2020-04-16 ENCOUNTER — Ambulatory Visit: Payer: Medicare PPO | Admitting: Internal Medicine

## 2020-04-16 ENCOUNTER — Encounter: Payer: Self-pay | Admitting: Internal Medicine

## 2020-04-16 VITALS — BP 112/86 | HR 79 | Ht 66.0 in | Wt 168.0 lb

## 2020-04-16 DIAGNOSIS — R002 Palpitations: Secondary | ICD-10-CM

## 2020-04-16 DIAGNOSIS — I10 Essential (primary) hypertension: Secondary | ICD-10-CM | POA: Diagnosis not present

## 2020-04-16 DIAGNOSIS — I491 Atrial premature depolarization: Secondary | ICD-10-CM

## 2020-04-16 DIAGNOSIS — E7800 Pure hypercholesterolemia, unspecified: Secondary | ICD-10-CM

## 2020-04-16 NOTE — Patient Instructions (Signed)

## 2020-04-16 NOTE — Progress Notes (Signed)
OFFICE NOTE  Chief Complaint:  No complaints  Primary Care Physician: Raynelle Jan., MD  HPI:  Rachel Wong  is a 79 year old female previously followed by Dr. Clarene Duke with history of palpitations improved with electrolyte replacement and adjustments in her beta blocker. She also has hypertension which has been well controlled, hyperlipidemia and diabetes. She reports being fairly stable. I reviewed results from her primary care provider's office. She recently had a cholesterol check which showed total cholesterol to be 158. Her triglycerides were slightly elevated and she is continuing to work on her diet. She has lost several pounds and I think this will be helpful for her. With her and her blood glucose control, her hemoglobin A1c is 5.8 and has been stable at that level. She denies any further palpitations. She occasionally gets cramps in her left leg and does have varicose veins. There is a slight amount of swelling and she previously had left knee surgery. Other than those few episodes, she is really asymptomatic.  Rachel Wong returns today for follow-up. She denies any palpitations. She reports that as long she takes her magnesium she seems to be doing fairly well. She is talking about possibly retiring in December. 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 Her blood pressure is well controlled. She reports her A1c is at 6 which represents good control.  Rachel Wong returns today for follow-up. She is reportedly asymptomatic, denying any palpitations, chest pain or shortness of breath. She has mildly low blood pressure today although reports a generally is between 120 and 140 systolic. Her EKG shows normal sinus rhythm without any ischemic changes. She occasionally gets some cramping in her legs and her hands. She is takes magnesium oxide but this is not remedy the problem. After talking to her daughter she is convinced this is related to pravastatin. She's been on this  for some time with good cholesterol control.  06/15/2016  Rachel Wong was seen today in follow-up. I recently saw her when she was hospitalized for acute gallstone pancreatitis. There was a brief episode of atrial fibrillation however frequent PACs were noted. This did not meet candidacy for anticoagulation. She's had no recurrence. Since discharge she has done well. She denies a chest pain or worsening shortness of breath. She is restarted her cholesterol medication and is due for repeat lipid profile. She also saw her endocrinologist who started her on Jardiance.   12/31/2016  Rachel Wong returns today for follow-up. Recently she started doing Silver sneakers when a friend told her about it and she found out it was free. She now exercises about a few times a week and uses a Systems analyst. She says she is moving a lot better and has less joint pain. She is less short of breath. She denies any chest pain or recurrent palpitations. Blood pressure is well-controlled today. Her cholesterol is well-controlled in January total cholesterol 139, HDL-C 45, LDL-C 66 and triglycerides 139.  10/06/2017  Rachel Wong returns today for follow-up.  She is done well over the past year.  She denies chest pain or worsening shortness of breath.  Blood pressure is well controlled.  EKG is stable showing sinus rhythm.  Blood sugars have been fairly well controlled on metformin and Jardiance.  She takes low-dose pravastatin.  She is compliant with daily aspirin.  04/11/2019  Rachel Wong is seen today in follow-up.  Overall she continues to do well.  She recently saw her primary care provider and her endocrinologist.  Her hemoglobin A1c has gone up from 6.1-7.  She is noted some dietary changes.  She attributes this to spending more time with her new boyfriend for which she is doing a lot of cooking.  Cholesterol has been fairly well controlled.  She denies any chest pain.  Her blood pressure is at goal today.  04/16/2020  Rachel Wong  returns today for follow-up.  Overall she seems to be doing well.  She has a scheduled follow-up with her PCP in a few weeks.  Her last A1c was 7.2.  Blood pressure is excellent today.  She denies any chest pain or worsening shortness of breath.  She is looking to start an exercise program again.  PMHx:  Past Medical History:  Diagnosis Date  . Choledocholithiasis 04/2016  . Complication of anesthesia   . Diabetes (HCC)   . 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 History of nuclear stress test 2006   adenosine; normal perfusion, no fixed or reversible defects  . History of palpitations   . Hyperlipidemia   . Hypertension   . PONV (postoperative nausea and vomiting)   . PVD (peripheral vascular disease) (HCC)     Past Surgical History:  Procedure Laterality Date  . CARDIAC CATHETERIZATION  05/2000   r/t false-positive stress test; normal coronaries - Dr. Mervyn Skeeters. [place]   . CHOLECYSTECTOMY N/A 05/02/2016   Procedure: LAPAROSCOPIC CHOLECYSTECTOMY WITH INTRAOPERATIVE CHOLANGIOGRAM;  Surgeon: Violeta Gelinas, MD;  Location: [facility] OR;  Service: General;  Laterality: N/A;  . ERCP N/A 04/29/2016   Procedure: ENDOSCOPIC RETROGRADE CHOLANGIOPANCREATOGRAPHY (ERCP);  Surgeon: Dorena Cookey, MD;  Location: [facility] ENDOSCOPY;  Service: Endoscopy;  Laterality: N/A;  . KNEE ARTHROSCOPY Right 10/2001  . ROTATOR CUFF REPAIR  1999  . TOE SURGERY Right 04/2003  . TRANSTHORACIC ECHOCARDIOGRAM  05/2010   EF=>55%, stage 1 diastolic dysfunction; mild MR; mild AVR & AV mildly sclerotic    FAMHx:  Family History  Problem Relation Age of Onset  . Cancer Mother   . Alzheimer's disease Mother   . Lung cancer Father   . Cancer Maternal Grandmother   . Heart attack Maternal Grandfather   . Liver disease Brother        transplant  . Thyroid disease Child        thyroidectomy    SOCHx:   reports that she quit smoking about 53 years ago. Her smoking use included cigarettes. She has a 20.00 pack-year smoking history.  She has never used smokeless tobacco. No history on file for alcohol use and drug use.  ALLERGIES:  No Known Allergies  ROS: Pertinent items noted in HPI and remainder of comprehensive ROS otherwise negative.  HOME MEDS: Current Outpatient Medications  Medication Sig Dispense Refill  . aspirin EC 81 MG tablet Take 81 mg by mouth daily.    . empagliflozin (JARDIANCE) 25 MG TABS tablet Take 25 mg by mouth daily.    . enalapril (VASOTEC) 20 MG tablet Take 20 mg by mouth daily.    . magnesium oxide (MAG-OX) 400 MG tablet Take 400 mg by mouth daily.    . metFORMIN (GLUCOPHAGE) 500 MG tablet Take 1,000 mg by mouth 2 (two) times daily with a meal.    . metoprolol succinate (TOPROL-XL) 100 MG 24 hr tablet Take 50 mg by mouth daily.     . Multiple Vitamin (MULTIVITAMIN) capsule Take 1 capsule by mouth daily.    . Omega-3 Fatty Acids (FISH OIL) 1000 MG CAPS Take 1 capsule  by mouth daily.     . pravastatin (PRAVACHOL) 40 MG tablet Take 1 tablet by mouth daily.     No current facility-administered medications for this visit.    LABS/IMAGING: No results found for this or any previous visit (from the past 48 hour(s)). No results found.  VITALS: BP 112/86   Pulse 79   Ht 5\' 6"  (1.676 m)   Wt 168 lb (76.2 kg)   SpO2 96%   BMI 27.12 kg/m   EXAM: General appearance: alert and no distress Neck: no carotid bruit, no JVD and thyroid not enlarged, symmetric, no tenderness/mass/nodules Lungs: clear to auscultation bilaterally Heart: regular rate and rhythm, S1, S2 normal, no murmur, click, rub or gallop Abdomen: soft, non-tender; bowel sounds normal; no masses,  no organomegaly Extremities: extremities normal, atraumatic, no cyanosis or edema and varicose veins noted Pulses: 2+ and symmetric Skin: Skin color, texture, turgor normal. No rashes or lesions Neurologic: Grossly normal Psych: Pleasant  EKG: Sinus rhythm with PACs at 79-personally reviewed  ASSESSMENT: 1. Palpitations -  PAC's 2. Hypertension-at goal on enalapril and metoprolol 3. Dyslipidemia-On pravastatin 4. 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   Rachel Wong seems to be doing well without any recurrent palpitations, although she is noted to have frequent PACs today.  Previous echo in 2017 showed moderate left atrial and mild right atrial enlargement.  She will need to monitor for the possible development of atrial fibrillation.  Blood pressure is well controlled.  She is on pravastatin with good cholesterol control followed by her PCP.  Her A1c is 7.2 which is slightly higher than it has been.  She is going to restart an exercise program soon.  No changes to her medicines today.  Follow-up with me annually or sooner as necessary.  2018, MD, [facility], FACP  [place]  [facility]  Medical Director of the Advanced Lipid Disorders &  Cardiovascular Risk Reduction Clinic Diplomate of the American Board of Clinical Lipidology Attending Cardiologist  Direct Dial: [phone]  Fax: [phone]  Website:  [email or web address] Daisean Brodhead 04/16/2020, 2:50 PM

## 2021-07-30 ENCOUNTER — Encounter: Payer: Self-pay | Admitting: Internal Medicine

## 2021-07-30 ENCOUNTER — Other Ambulatory Visit: Payer: Self-pay

## 2021-07-30 ENCOUNTER — Ambulatory Visit: Payer: Medicare PPO | Admitting: Internal Medicine

## 2021-07-30 VITALS — BP 94/61 | HR 95 | Ht 65.05 in | Wt 151.8 lb

## 2021-07-30 DIAGNOSIS — E7800 Pure hypercholesterolemia, unspecified: Secondary | ICD-10-CM | POA: Diagnosis not present

## 2021-07-30 DIAGNOSIS — R002 Palpitations: Secondary | ICD-10-CM

## 2021-07-30 DIAGNOSIS — I10 Essential (primary) hypertension: Secondary | ICD-10-CM | POA: Diagnosis not present

## 2021-07-30 MED ORDER — ENALAPRIL MALEATE 5 MG PO TABS: 5.0000 mg | ORAL_TABLET | Freq: Every day | ORAL | 3 refills | Status: AC

## 2021-07-30 NOTE — Progress Notes (Signed)
OFFICE NOTE  Chief Complaint:  No complaints  Primary Care Physician: Raynelle Jan., MD  HPI:  Rachel Wong  is a 81 year old female previously followed by Dr. Clarene Duke with history of palpitations improved with electrolyte replacement and adjustments in her beta blocker. She also has hypertension which has been well controlled, hyperlipidemia and diabetes. She reports being fairly stable. I reviewed results from her primary care provider's office. She recently had a cholesterol check which showed total cholesterol to be 158. Her triglycerides were slightly elevated and she is continuing to work on her diet. She has lost several pounds and I think this will be helpful for her. With her and her blood glucose control, her hemoglobin A1c is 5.8 and has been stable at that level. She denies any further palpitations. She occasionally gets cramps in her left leg and does have varicose veins. There is a slight amount of swelling and she previously had left knee surgery. Other than those few episodes, she is really asymptomatic.  Rachel Wong returns today for follow-up. She denies any palpitations. She reports that as long she takes her magnesium she seems to be doing fairly well. She is talking about possibly retiring in December. 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 Her blood pressure is well controlled. She reports her A1c is at 6 which represents good control.  Rachel Wong returns today for follow-up. She is reportedly asymptomatic, denying any palpitations, chest pain or shortness of breath. She has mildly low blood pressure today although reports a generally is between 120 and 140 systolic. Her EKG shows normal sinus rhythm without any ischemic changes. She occasionally gets some cramping in her legs and her hands. She is takes magnesium oxide but this is not remedy the problem. After talking to her daughter she is convinced this is related to pravastatin. She's been on this  for some time with good cholesterol control.  06/15/2016  Rachel Wong was seen today in follow-up. I recently saw her when she was hospitalized for acute gallstone pancreatitis. There was a brief episode of atrial fibrillation however frequent PACs were noted. This did not meet candidacy for anticoagulation. She's had no recurrence. Since discharge she has done well. She denies a chest pain or worsening shortness of breath. She is restarted her cholesterol medication and is due for repeat lipid profile. She also saw her endocrinologist who started her on Jardiance.   12/31/2016  Rachel Wong returns today for follow-up. Recently she started doing Silver sneakers when a friend told her about it and she found out it was free. She now exercises about a few times a week and uses a Systems analyst. She says she is moving a lot better and has less joint pain. She is less short of breath. She denies any chest pain or recurrent palpitations. Blood pressure is well-controlled today. Her cholesterol is well-controlled in January total cholesterol 139, HDL-C 45, LDL-C 66 and triglycerides 139.  10/06/2017  Rachel Wong returns today for follow-up.  She is done well over the past year.  She denies chest pain or worsening shortness of breath.  Blood pressure is well controlled.  EKG is stable showing sinus rhythm.  Blood sugars have been fairly well controlled on metformin and Jardiance.  She takes low-dose pravastatin.  She is compliant with daily aspirin.  04/11/2019  Rachel Wong is seen today in follow-up.  Overall she continues to do well.  She recently saw her primary care provider and her endocrinologist.  Her hemoglobin A1c has gone up from 6.1-7.  She is noted some dietary changes.  She attributes this to spending more time with her new boyfriend for which she is doing a lot of cooking.  Cholesterol has been fairly well controlled.  She denies any chest pain.  Her blood pressure is at goal today.  04/16/2020  Rachel Wong  returns today for follow-up.  Overall she seems to be doing well.  She has a scheduled follow-up with her PCP in a few weeks.  Her last A1c was 7.2.  Blood pressure is excellent today.  She denies any chest pain or worsening shortness of breath.  She is looking to start an exercise program again.  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 Langston Reusing    CHOLECYSTECTOMY N/A 05/02/2016   Procedure: LAPAROSCOPIC CHOLECYSTECTOMY WITH INTRAOPERATIVE CHOLANGIOGRAM;  Surgeon: Violeta Gelinas, MD;  Location: [facility] OR;  Service: General;  Laterality: N/A;   ERCP N/A 04/29/2016   Procedure: ENDOSCOPIC RETROGRADE CHOLANGIOPANCREATOGRAPHY (ERCP);  Surgeon: Dorena Cookey, MD;  Location[facility] ENDOSCOPY;  Service: Endoscopy;  Laterality: N/A;   KNEE ARTHROSCOPY Right 10/2001   ROTATOR CUFF REPAIR  1999   TOE SURGERY Right 04/2003   TRANSTHORACIC ECHOCARDIOGRAM  05/2010   EF=>55%, stage 1 diastolic dysfunction; mild MR; mild AVR & AV mildly sclerotic    FAMHx:  Family History  Problem Relation Age of Onset   Cancer Mother    Alzheimer's disease Mother    Lung cancer Father    Cancer Maternal Grandmother    Heart attack Maternal Grandfather    Liver disease Brother        transplant   Thyroid disease Child        thyroidectomy    SOCHx:   reports that she quit smoking about 54 years ago. Her smoking use included cigarettes. She has a 20.00 pack-year smoking history. She has never used  smokeless tobacco. No history on file for alcohol use and drug use.  ALLERGIES:  Allergies  Allergen Reactions   Clindamycin Diarrhea and Rash    ROS: Pertinent items noted in HPI and remainder of comprehensive ROS otherwise negative.  HOME MEDS: Current Outpatient Medications  Medication Sig Dispense Refill   aspirin EC 81 MG tablet Take 81 mg by mouth daily.     Cholecalciferol 25 MCG (1000 UT) tablet Take by mouth.     Coenzyme Q10 (CO Q10) 100 MG CAPS Take by mouth.     empagliflozin (JARDIANCE) 25 MG TABS tablet Take 25 mg by mouth daily.     enalapril (VASOTEC) 10 MG tablet Take by mouth.     magnesium oxide (MAG-OX) 400 MG tablet Take 400 mg by mouth daily.     metFORMIN (GLUCOPHAGE) 500 MG tablet Take 1,000 mg by mouth 2 (two) times daily with a meal.     metoprolol succinate (TOPROL-XL) 100 MG 24 hr tablet Take 50 mg  by mouth daily.      Multiple Vitamin (MULTIVITAMIN) capsule Take 1 capsule by mouth daily.     Omega-3 Fatty Acids (FISH OIL) 1000 MG CAPS Take 1 capsule by mouth daily.      omeprazole (PRILOSEC) 40 MG capsule Take 1 capsule by mouth 2 (two) times daily before a meal.     pravastatin (PRAVACHOL) 40 MG tablet Take 1 tablet by mouth daily.     No current facility-administered medications for this visit.    LABS/IMAGING: No results found for this or any previous visit (from the past 48 hour(s)). No results found.  VITALS: BP 94/61    Pulse 95    Ht 5' 5.05" (1.652 m)    Wt 151 lb 12.8 oz (68.9 kg)    SpO2 97%    BMI 25.22 kg/m   EXAM: General appearance: alert and no distress Neck: no carotid bruit, no JVD and thyroid not enlarged, symmetric, no tenderness/mass/nodules Lungs: clear to auscultation bilaterally Heart: regular rate and rhythm, S1, S2 normal, no murmur, click, rub or gallop Abdomen: soft, non-tender; bowel sounds normal; no masses,  no organomegaly Extremities: extremities normal, atraumatic, no cyanosis or edema and varicose veins  noted Pulses: 2+ and symmetric Skin: Skin color, texture, turgor normal. 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   Mrs. Jasko seems to be doing well without any recurrent palpitations, although she is noted to have frequent PACs today.  Previous echo in 2017 showed moderate left atrial and mild right atrial enlargement.  She will need to monitor for the possible development of atrial fibrillation.  Blood pressure is well controlled.  She is on pravastatin with good cholesterol control followed by her PCP.  Her A1c is 7.2 which is slightly higher than it has been.  She is going to restart an exercise program soon.  No changes to her medicines today.  Follow-up with me annually or sooner as necessary.  Chrystie Nose, MD, [facility], FACP     [facility]  Medical Director of the Advanced Lipid Disorders &  Cardiovascular Risk Reduction Clinic Diplomate of the American Board of Clinical Lipidology Attending Cardiologist  Direct Dial: [phone]   Fax: [phone]  Website:  [email or web address] Nicely Lemario Chaikin 07/30/2021, 3:22 PM

## 2021-07-30 NOTE — Patient Instructions (Signed)
Medication Instructions:  DECREASE enalapril to 5mg  daily  -- new prescription sent to pharmacy  -- you can cut the current 10mg  tablets in half if you would like   *If you need a refill on your cardiac medications before your next appointment, please call your pharmacy*   Follow-Up: At [facility], you and your health needs are our priority.  As part of our continuing mission to provide you with exceptional heart care, we have created designated Provider Care Teams.  These Care Teams include your primary Cardiologist (physician) and Advanced Practice Providers (APPs -  Physician Assistants and Nurse Practitioners) who all work together to provide you with the care you need, when you need it.  We recommend signing up for the patient portal called "MyChart".  Sign up information is provided on this After Visit Summary.  MyChart is used to connect with patients for Virtual Visits (Telemedicine).  Patients are able to view lab/test results, encounter notes, upcoming appointments, etc.  Non-urgent messages can be sent to your provider as well.   To learn more about what you can do with MyChart, go to [email or web address].    Your next appointment:    1 year with Dr. Debara Pickett

## 2021-07-30 NOTE — Progress Notes (Signed)
OFFICE NOTE  Chief Complaint:  No complaints  Primary Care Physician: Raynelle Jan., MD  HPI:  Rachel Wong  is a 81 year old female previously followed by Dr. Clarene Duke with history of palpitations improved with electrolyte replacement and adjustments in her beta blocker. She also has hypertension which has been well controlled, hyperlipidemia and diabetes. She reports being fairly stable. I reviewed results from her primary care provider's office. She recently had a cholesterol check which showed total cholesterol to be 158. Her triglycerides were slightly elevated and she is continuing to work on her diet. She has lost several pounds and I think this will be helpful for her. With her and her blood glucose control, her hemoglobin A1c is 5.8 and has been stable at that level. She denies any further palpitations. She occasionally gets cramps in her left leg and does have varicose veins. There is a slight amount of swelling and she previously had left knee surgery. Other than those few episodes, she is really asymptomatic.  Rachel Wong returns today for follow-up. She denies any palpitations. She reports that as long she takes her magnesium she seems to be doing fairly well. She is talking about possibly retiring in December. 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 Her blood pressure is well controlled. She reports her A1c is at 6 which represents good control.  Rachel Wong returns today for follow-up. She is reportedly asymptomatic, denying any palpitations, chest pain or shortness of breath. She has mildly low blood pressure today although reports a generally is between 120 and 140 systolic. Her EKG shows normal sinus rhythm without any ischemic changes. She occasionally gets some cramping in her legs and her hands. She is takes magnesium oxide but this is not remedy the problem. After talking to her daughter she is convinced this is related to pravastatin. She's been on this  for some time with good cholesterol control.  06/15/2016  Rachel Wong was seen today in follow-up. I recently saw her when she was hospitalized for acute gallstone pancreatitis. There was a brief episode of atrial fibrillation however frequent PACs were noted. This did not meet candidacy for anticoagulation. She's had no recurrence. Since discharge she has done well. She denies a chest pain or worsening shortness of breath. She is restarted her cholesterol medication and is due for repeat lipid profile. She also saw her endocrinologist who started her on Jardiance.   12/31/2016  Rachel Wong returns today for follow-up. Recently she started doing Silver sneakers when a friend told her about it and she found out it was free. She now exercises about a few times a week and uses a Systems analyst. She says she is moving a lot better and has less joint pain. She is less short of breath. She denies any chest pain or recurrent palpitations. Blood pressure is well-controlled today. Her cholesterol is well-controlled in January total cholesterol 139, HDL-C 45, LDL-C 66 and triglycerides 139.  10/06/2017  Rachel Wong returns today for follow-up.  She is done well over the past year.  She denies chest pain or worsening shortness of breath.  Blood pressure is well controlled.  EKG is stable showing sinus rhythm.  Blood sugars have been fairly well controlled on metformin and Jardiance.  She takes low-dose pravastatin.  She is compliant with daily aspirin.  04/11/2019  Rachel Wong is seen today in follow-up.  Overall she continues to do well.  She recently saw her primary care provider and her endocrinologist.  Her hemoglobin A1c has gone up from 6.1-7.  She is noted some dietary changes.  She attributes this to spending more time with her new boyfriend for which she is doing a lot of cooking.  Cholesterol has been fairly well controlled.  She denies any chest pain.  Her blood pressure is at goal today.  04/16/2020  Rachel Wong  returns today for follow-up.  Overall she seems to be doing well.  She has a scheduled follow-up with her PCP in a few weeks.  Her last A1c was 7.2.  Blood pressure is excellent today.  She denies any chest pain or worsening shortness of breath.  She is looking to start an exercise program again.  07/30/2021  Rachel Wong is seen today in follow-up.  She is doing very well at [facility].  She is exercising regularly and is lost weight since I saw her.  Weight is down to 151 from 168.  Accordingly her blood pressure has been running lower.  When she saw her PCP in November it was in the 80s systolic and they decreased her enalapril from 20 to 10 mg.  Today it was 94/61 and the highest reading she has had recently was in the 110 range.  I think she could actually lower the dose further to 5 mg daily.  Lipids have been well controlled with recent total cholesterol 131, triglycerides 155 HDL 46 and LDL 85.  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 Langston Reusing    CHOLECYSTECTOMY N/A 05/02/2016   Procedure: LAPAROSCOPIC CHOLECYSTECTOMY WITH INTRAOPERATIVE CHOLANGIOGRAM;  Surgeon: Violeta Gelinas, MD;  Location: [facility] OR;  Service: General;  Laterality: N/A;   ERCP N/A 04/29/2016   Procedure: ENDOSCOPIC RETROGRADE CHOLANGIOPANCREATOGRAPHY (ERCP);  Surgeon: Dorena Cookey, MD;  Location: [facility] ENDOSCOPY;  Service: Endoscopy;  Laterality: N/A;   KNEE  ARTHROSCOPY Right 10/2001   ROTATOR CUFF REPAIR  1999   TOE SURGERY Right 04/2003   TRANSTHORACIC ECHOCARDIOGRAM  05/2010   EF=>55%, stage 1 diastolic dysfunction; mild MR; mild AVR & AV mildly sclerotic    FAMHx:  Family History  Problem Relation Age of Onset   Cancer Mother    Alzheimer's disease Mother    Lung cancer Father    Cancer Maternal Grandmother    Heart attack Maternal Grandfather    Liver disease Brother        transplant   Thyroid disease Child        thyroidectomy    SOCHx:   reports that she quit smoking about 54 years ago. Her smoking use included cigarettes. She has a 20.00 pack-year smoking history. She has never used smokeless tobacco. No history on file for alcohol use and drug use.  ALLERGIES:  Allergies  Allergen Reactions   Clindamycin Diarrhea and Rash    ROS: Pertinent items noted in HPI and remainder of comprehensive ROS otherwise  negative.  HOME MEDS: Current Outpatient Medications  Medication Sig Dispense Refill   aspirin EC 81 MG tablet Take 81 mg by mouth daily.     Cholecalciferol 25 MCG (1000 UT) tablet Take by mouth.     Coenzyme Q10 (CO Q10) 100 MG CAPS Take by mouth.     empagliflozin (JARDIANCE) 25 MG TABS tablet Take 25 mg by mouth daily.     enalapril (VASOTEC) 10 MG tablet Take by mouth.     magnesium oxide (MAG-OX) 400 MG tablet Take 400 mg by mouth daily.     metFORMIN (GLUCOPHAGE) 500 MG tablet Take 1,000 mg by mouth 2 (two) times daily with a meal.     metoprolol succinate (TOPROL-XL) 100 MG 24 hr tablet Take 50 mg by mouth daily.      Multiple Vitamin (MULTIVITAMIN) capsule Take 1 capsule by mouth daily.     Omega-3 Fatty Acids (FISH OIL) 1000 MG CAPS Take 1 capsule by mouth daily.      omeprazole (PRILOSEC) 40 MG capsule Take 1 capsule by mouth 2 (two) times daily before a meal.     pravastatin (PRAVACHOL) 40 MG tablet Take 1 tablet by mouth daily.     No current facility-administered medications for this visit.     LABS/IMAGING: No results found for this or any previous visit (from the past 48 hour(s)). No results found.  VITALS: BP 94/61    Pulse 95    Ht 5' 5.05" (1.652 m)    Wt 151 lb 12.8 oz (68.9 kg)    SpO2 97%    BMI 25.22 kg/m   EXAM: General appearance: alert and no distress Neck: no carotid bruit, no JVD and thyroid not enlarged, symmetric, no tenderness/mass/nodules Lungs: clear to auscultation bilaterally Heart: regular rate and rhythm, S1, S2 normal, no murmur, click, rub or gallop Abdomen: soft, non-tender; bowel sounds normal; no masses,  no organomegaly Extremities: extremities normal, atraumatic, no cyanosis or edema and varicose veins noted Pulses: 2+ and symmetric Skin: Skin color, texture, turgor normal. No rashes or lesions Neurologic: Grossly normal Psych: Pleasant  EKG: Normal sinus rhythm at 95  -personally reviewed  ASSESSMENT: Palpitations - PAC's Hypertension-at goal on enalapril and metoprolol Dyslipidemia-On pravastatin IDDM -well controlled, A1c 6.2 (on Jardiance and insulin)  PLAN: 1.   Rachel Wong has had about 15 pound intentional weight loss and blood pressure is running lower.  I think we can further reduce her enalapril from 10 to 5 mg daily.  She denies any significant recurrent palpitations.  EKG today was normal.  Her cholesterol is well controlled.  A1c is coming down as well.  I think she is doing very well and encouraged her to continue with her exercise and dietary regimen.  Plan follow-up with me annually or sooner as necessary.  Chrystie Nose, MD, [facility], FACP  [place]   [facility]  Medical Director of the Advanced Lipid Disorders &  Cardiovascular Risk Reduction Clinic Diplomate of the American Board of Clinical Lipidology Attending Cardiologist  Direct Dial: [phone]   Fax: [phone]  Website:  [email or web address] 07/30/2021, 3:22 PM

## 2022-09-14 ENCOUNTER — Encounter: Payer: Self-pay | Admitting: Internal Medicine

## 2022-09-14 ENCOUNTER — Ambulatory Visit: Payer: Medicare PPO | Attending: Internal Medicine | Admitting: Internal Medicine

## 2022-09-14 VITALS — BP 138/52 | HR 102 | Ht 66.0 in | Wt 167.8 lb

## 2022-09-14 DIAGNOSIS — I480 Paroxysmal atrial fibrillation: Secondary | ICD-10-CM

## 2022-09-14 DIAGNOSIS — I10 Essential (primary) hypertension: Secondary | ICD-10-CM | POA: Diagnosis not present

## 2022-09-14 DIAGNOSIS — I491 Atrial premature depolarization: Secondary | ICD-10-CM | POA: Diagnosis not present

## 2022-09-14 NOTE — Progress Notes (Signed)
OFFICE NOTE  Chief Complaint:  Follow-up hospitalization  Primary Care Physician: Raynelle Jan., MD  HPI:  Rachel Wong  is a 82 year old female previously followed by Dr. Clarene Duke with history of palpitations improved with electrolyte replacement and adjustments in her beta blocker. She also has hypertension which has been well controlled, hyperlipidemia and diabetes. She reports being fairly stable. I reviewed results from her primary care provider's office. She recently had a cholesterol check which showed total cholesterol to be 158. Her triglycerides were slightly elevated and she is continuing to work on her diet. She has lost several pounds and I think this will be helpful for her. With her and her blood glucose control, her hemoglobin A1c is 5.8 and has been stable at that level. She denies any further palpitations. She occasionally gets cramps in her left leg and does have varicose veins. There is a slight amount of swelling and she previously had left knee surgery. Other than those few episodes, she is really asymptomatic.  Rachel Wong returns today for follow-up. She denies any palpitations. She reports that as long she takes her magnesium she seems to be doing fairly well. She is talking about possibly retiring in December. 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 Her blood pressure is well controlled. She reports her A1c is at 6 which represents good control.  Rachel Wong returns today for follow-up. She is reportedly asymptomatic, denying any palpitations, chest pain or shortness of breath. She has mildly low blood pressure today although reports a generally is between 120 and 140 systolic. Her EKG shows normal sinus rhythm without any ischemic changes. She occasionally gets some cramping in her legs and her hands. She is takes magnesium oxide but this is not remedy the problem. After talking to her daughter she is convinced this is related to pravastatin. She's been  on this for some time with good cholesterol control.  06/15/2016  Rachel Wong was seen today in follow-up. I recently saw her when she was hospitalized for acute gallstone pancreatitis. There was a brief episode of atrial fibrillation however frequent PACs were noted. This did not meet candidacy for anticoagulation. She's had no recurrence. Since discharge she has done well. She denies a chest pain or worsening shortness of breath. She is restarted her cholesterol medication and is due for repeat lipid profile. She also saw her endocrinologist who started her on Jardiance.   12/31/2016  Rachel Wong returns today for follow-up. Recently she started doing Silver sneakers when a friend told her about it and she found out it was free. She now exercises about a few times a week and uses a Systems analyst. She says she is moving a lot better and has less joint pain. She is less short of breath. She denies any chest pain or recurrent palpitations. Blood pressure is well-controlled today. Her cholesterol is well-controlled in January total cholesterol 139, HDL-C 45, LDL-C 66 and triglycerides 139.  10/06/2017  Rachel Wong returns today for follow-up.  She is done well over the past year.  She denies chest pain or worsening shortness of breath.  Blood pressure is well controlled.  EKG is stable showing sinus rhythm.  Blood sugars have been fairly well controlled on metformin and Jardiance.  She takes low-dose pravastatin.  She is compliant with daily aspirin.  04/11/2019  Rachel Wong is seen today in follow-up.  Overall she continues to do well.  She recently saw her primary care provider and her endocrinologist.  Her  hemoglobin A1c has gone up from 6.1-7.  She is noted some dietary changes.  She attributes this to spending more time with her new boyfriend for which she is doing a lot of cooking.  Cholesterol has been fairly well controlled.  She denies any chest pain.  Her blood pressure is at goal today.  04/16/2020  [name] returns today for follow-up.  Overall she seems to be doing well.  She has a scheduled follow-up with her PCP in a few weeks.  Her last A1c was 7.2.  Blood pressure is excellent today.  She denies any chest pain or worsening shortness of breath.  She is looking to start an exercise program again.  07/30/2021  Rachel Wong is seen today in follow-up.  She is doing very well at [facility].  She is exercising regularly and is lost weight since I saw her.  Weight is down to 151 from 168.  Accordingly her blood pressure has been running lower.  When she saw her PCP in November it was in the 80s systolic and they decreased her enalapril from 20 to 10 mg.  Today it was 94/61 and the highest reading she has had recently was in the 110 range.  I think she could actually lower the dose further to 5 mg daily.  Lipids have been well controlled with recent total cholesterol 131, triglycerides 155 HDL 46 and LDL 85.  She reports no new or worsening palpitations  09/14/2022  Rachel Wong for follow-up.  Unfortunately she was hospitalized just a few days ago with a stroke.  She said she had word finding difficulty at home and told her daughter and was ultimately taken to the doctor's office.  EKG there showed new onset atrial fibrillation.  She was transferred to the [facility] regional emergency room and was evaluated including a cardiac evaluation.  She had an echocardiogram which showed normal LVEF 60 to 65%, moderate left atrial enlargement, mild MR, mild TR and aortic valve sclerosis.  She also had carotid Dopplers which showed mild bilateral carotid artery stenosis.  Her aspirin was stopped and she was switched to Eliquis.  She may have spontaneously converted to sinus rhythm.  EKG today shows sinus rhythm with PACs.  PMHx:  Past Medical History:  Diagnosis Date   Choledocholithiasis 04/2016   Complication of anesthesia    Diabetes    Diabetic ulcer of left great toe    History of nuclear stress test 2006    adenosine; normal perfusion, no fixed or reversible defects   History of palpitations    Hyperlipidemia    Hypertension    PONV (postoperative nausea and vomiting)    PVD (peripheral vascular disease)     Past Surgical History:  Procedure Laterality Date   CARDIAC CATHETERIZATION  05/2000   r/t false-positive stress test; normal coronaries - Dr. Langston Reusing    CHOLECYSTECTOMY N/A 05/02/2016   Procedure: LAPAROSCOPIC CHOLECYSTECTOMY WITH INTRAOPERATIVE CHOLANGIOGRAM;  Surgeon: Violeta Gelinas, MD;  Location: [facility] OR;  Service: General;  Laterality: N/A;   ERCP N/A 04/29/2016   Procedure: ENDOSCOPIC RETROGRADE CHOLANGIOPANCREATOGRAPHY (ERCP);  Surgeon: Dorena Cookey, MD;  Location: [facility] ENDOSCOPY;  Service: Endoscopy;  Laterality: N/A;   KNEE ARTHROSCOPY Right 10/2001   ROTATOR CUFF REPAIR  1999   TOE SURGERY Right 04/2003   TRANSTHORACIC ECHOCARDIOGRAM  05/2010   EF=>55%, stage 1 diastolic dysfunction; mild MR; mild AVR & AV mildly sclerotic    FAMHx:  Family History  Problem Relation Age of  Onset   Cancer Mother    Alzheimer's disease Mother    Lung cancer Father    Cancer Maternal Grandmother    Heart attack Maternal Grandfather    Liver disease Brother        transplant   Thyroid disease Child        thyroidectomy    SOCHx:   reports that she quit smoking about 55 years ago. Her smoking use included cigarettes. She has a 20.00 pack-year smoking history. She has never used smokeless tobacco. No history on file for alcohol use and drug use.  ALLERGIES:  Allergies  Allergen Reactions   Clindamycin Diarrhea and Rash    ROS: Pertinent items noted in HPI and remainder of comprehensive ROS otherwise negative.  HOME MEDS: Current Outpatient Medications  Medication Sig Dispense Refill   apixaban (ELIQUIS) 5 MG TABS tablet Take 5 mg by mouth 2 (two) times daily.     Cholecalciferol 25 MCG (1000 UT) tablet Take by mouth.     Coenzyme Q10 (CO Q10) 100 MG CAPS Take by mouth.      empagliflozin (JARDIANCE) 25 MG TABS tablet Take 25 mg by mouth daily.     enalapril (VASOTEC) 5 MG tablet Take 1 tablet (5 mg total) by mouth daily. 90 tablet 3   magnesium oxide (MAG-OX) 400 MG tablet Take 400 mg by mouth daily.     metFORMIN (GLUCOPHAGE) 500 MG tablet Take 1,000 mg by mouth 2 (two) times daily with a meal.     metoprolol succinate (TOPROL-XL) 100 MG 24 hr tablet Take 50 mg by mouth daily.      Multiple Vitamin (MULTIVITAMIN) capsule Take 1 capsule by mouth daily.     Omega-3 Fatty Acids (FISH OIL) 1000 MG CAPS Take 1 capsule by mouth daily.      omeprazole (PRILOSEC) 40 MG capsule Take 1 capsule by mouth 2 (two) times daily before a meal.     pravastatin (PRAVACHOL) 40 MG tablet Take 1 tablet by mouth daily.     aspirin EC 81 MG tablet Take 81 mg by mouth daily. (Patient not taking: Reported on 09/14/2022)     No current facility-administered medications for this visit.    LABS/IMAGING: No results found for this or any previous visit (from the past 48 hour(s)). No results found.  VITALS: BP (!) 138/52 (BP Location: Left Arm, Patient Position: Sitting, Cuff Size: Normal)   Pulse (!) 102   Ht 5\' 6"  (1.676 m)   Wt 167 lb 12.8 oz (76.1 kg)   SpO2 94%   BMI 27.08 kg/m   EXAM: General appearance: alert and no distress Neck: no carotid bruit, no JVD and thyroid not enlarged, symmetric, no tenderness/mass/nodules Lungs: clear to auscultation bilaterally Heart: regular rate and rhythm, S1, S2 normal, no murmur, click, rub or gallop Abdomen: soft, non-tender; bowel sounds normal; no masses,  no organomegaly Extremities: extremities normal, atraumatic, no cyanosis or edema and varicose veins noted Pulses: 2+ and symmetric Skin: Skin color, texture, turgor normal. No rashes or lesions Neurologic: Grossly normal Psych: Pleasant  EKG: Normal sinus rhythm with PACs at 97-personally reviewed  ASSESSMENT: Status post stroke PAF-on Eliquis Palpitations -  PAC's Hypertension-at goal on enalapril and metoprolol Dyslipidemia-On pravastatin IDDM -well controlled, A1c 6.2 (on Jardiance and insulin)  PLAN: 1.   Rachel Wong has a history of PACs and unfortunately developed A-fib and presented with stroke.  She has not had any deficits from this which is also fortunate.  She is  now on Eliquis and aspirin was discontinued.  She is in sinus rhythm today with PACs.  There would be no benefit towards cardioversion.  Blood pressure appears well-controlled.  Would recommend continuing further therapy for now.  Plan follow-up with me annually or sooner as needed.  Chrystie NoseKenneth C. Dayanara Sherrill, MD, [facility] Memorial HospitalFACC, FACP  [place]  [facility]  Medical Director of the Advanced Lipid Disorders &  Cardiovascular Risk Reduction Clinic Diplomate of the American Board of Clinical Lipidology Attending Cardiologist  Direct Dial: [phone]  Fax: [phone]  Website:  [email or web address]  Davionne Mastrangelo C Sharalyn Lomba 09/14/2022, 2:02 PM

## 2022-09-14 NOTE — Patient Instructions (Addendum)
Medication Instructions:  NO CHANGES  *If you need a refill on your cardiac medications before your next appointment, please call your pharmacy*   Follow-Up: At [facility], you and your health needs are our priority.  As part of our continuing mission to provide you with exceptional heart care, we have created designated Provider Care Teams.  These Care Teams include your primary Cardiologist (physician) and Advanced Practice Providers (APPs -  Physician Assistants and Nurse Practitioners) who all work together to provide you with the care you need, when you need it.  We recommend signing up for the patient portal called "MyChart".  Sign up information is provided on this After Visit Summary.  MyChart is used to connect with patients for Virtual Visits (Telemedicine).  Patients are able to view lab/test results, encounter notes, upcoming appointments, etc.  Non-urgent messages can be sent to your provider as well.   To learn more about what you can do with MyChart, go to [email or web address].    Your next appointment:    6 months with Dr. Rennis Golden  ** call mid-May/early June for an appointment in October

## 2022-11-12 ENCOUNTER — Telehealth: Payer: Self-pay | Admitting: Internal Medicine

## 2022-11-12 NOTE — Telephone Encounter (Signed)
    Primary Cardiologist: Chrystie Nose, MD  Chart reviewed as part of pre-operative protocol coverage. Simple dental extractions are considered low risk procedures per guidelines and generally do not require any specific cardiac clearance. It is also generally accepted that for simple extractions and dental cleanings, there is no need to interrupt blood thinner therapy.   SBE prophylaxis is no required for the patient.  I will route this recommendation to the requesting party via Epic fax function and remove from pre-op pool.  Please call with questions.  Sharlene Dory, PA-C 11/12/2022, 1:21 PM

## 2022-11-12 NOTE — Telephone Encounter (Signed)
    [facility] Pre-operative Risk Assessment    Request for surgical clearance:  What type of surgery is being performed?  Cleaning + Xrays if needed   When is this surgery scheduled?  12/07/22   What type of clearance is required (medical clearance vs. Pharmacy clearance to hold med vs. Both)?  Both   Are there any medications that need to be held prior to surgery and how long? Their office just wants to know if the patient need to pre-med   Practice name and name of physician performing surgery?  Dr[facility] Fel[facility]  Dr. Dannielle Burn   What is your office phone number? [phone]    7.   What is your office fax number? [phone]  8.   Anesthesia type (None, local, MAC, general) ?  None   Rolly Pancake 11/12/2022, 10:58 AM

## 2022-12-15 ENCOUNTER — Encounter: Payer: Self-pay | Admitting: Internal Medicine

## 2022-12-15 ENCOUNTER — Telehealth: Payer: Self-pay | Admitting: Internal Medicine

## 2022-12-15 DIAGNOSIS — I491 Atrial premature depolarization: Secondary | ICD-10-CM

## 2022-12-15 DIAGNOSIS — I480 Paroxysmal atrial fibrillation: Secondary | ICD-10-CM

## 2022-12-15 DIAGNOSIS — Z79899 Other long term (current) drug therapy: Secondary | ICD-10-CM

## 2022-12-15 MED ORDER — APIXABAN 5 MG PO TABS
5.0000 mg | ORAL_TABLET | Freq: Two times a day (BID) | ORAL | 3 refills | Status: DC
Start: 2022-12-15 — End: 2023-10-12

## 2022-12-15 NOTE — Telephone Encounter (Signed)
Left voicemail message for the pt to call the clinic. Sent prescription for Eliquis 5 mg BID to the pharmacy on file.

## 2022-12-15 NOTE — Telephone Encounter (Signed)
  Per MyChart scheduling message:  Need prescription for Eliquis called in to [facility]. My original prescription came from [facility] and has expired. I need a new prescription from Dr Rennis Golden in order to continue taking. That is all I need.   Medication is not on medication list

## 2023-04-26 ENCOUNTER — Ambulatory Visit: Payer: Medicare PPO | Attending: Internal Medicine | Admitting: Internal Medicine

## 2023-04-26 ENCOUNTER — Encounter: Payer: Self-pay | Admitting: Internal Medicine

## 2023-04-26 VITALS — BP 128/72 | HR 66 | Ht 66.0 in | Wt 160.6 lb

## 2023-04-26 DIAGNOSIS — I10 Essential (primary) hypertension: Secondary | ICD-10-CM | POA: Diagnosis not present

## 2023-04-26 DIAGNOSIS — I480 Paroxysmal atrial fibrillation: Secondary | ICD-10-CM | POA: Diagnosis not present

## 2023-04-26 DIAGNOSIS — R011 Cardiac murmur, unspecified: Secondary | ICD-10-CM

## 2023-04-26 NOTE — Patient Instructions (Addendum)
Medication Instructions:  NO CHANGES  *If you need a refill on your cardiac medications before your next appointment, please call your pharmacy*   Follow-Up: At [facility], you and your health needs are our priority.  As part of our continuing mission to provide you with exceptional heart care, we have created designated Provider Care Teams.  These Care Teams include your primary Cardiologist (physician) and Advanced Practice Providers (APPs -  Physician Assistants and Nurse Practitioners) who all work together to provide you with the care you need, when you need it.  We recommend signing up for the patient portal called "MyChart".  Sign up information is provided on this After Visit Summary.  MyChart is used to connect with patients for Virtual Visits (Telemedicine).  Patients are able to view lab/test results, encounter notes, upcoming appointments, etc.  Non-urgent messages can be sent to your provider as well.   To learn more about what you can do with MyChart, go to [email or web address].    Your next appointment:   6 months with Dr. Rennis Golden  CALL in January for next visit

## 2023-04-26 NOTE — Progress Notes (Unsigned)
OFFICE NOTE  Chief Complaint:  Follow-up  Primary Care Physician: Raynelle Jan., MD  HPI:  Rachel Wong  is a 82 year old female previously followed by Dr. Clarene Duke with history of palpitations improved with electrolyte replacement and adjustments in her beta blocker. She also has hypertension which has been well controlled, hyperlipidemia and diabetes. She reports being fairly stable. I reviewed results from her primary care provider's office. She recently had a cholesterol check which showed total cholesterol to be 158. Her triglycerides were slightly elevated and she is continuing to work on her diet. She has lost several pounds and I think this will be helpful for her. With her and her blood glucose control, her hemoglobin A1c is 5.8 and has been stable at that level. She denies any further palpitations. She occasionally gets cramps in her left leg and does have varicose veins. There is a slight amount of swelling and she previously had left knee surgery. Other than those few episodes, she is really asymptomatic.  Rachel Wong returns today for follow-up. She denies any palpitations. She reports that as long she takes her magnesium she seems to be doing fairly well. She is talking about possibly retiring in December. 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 Her blood pressure is well controlled. She reports her A1c is at 6 which represents good control.  Rachel Wong returns today for follow-up. She is reportedly asymptomatic, denying any palpitations, chest pain or shortness of breath. She has mildly low blood pressure today although reports a generally is between 120 and 140 systolic. Her EKG shows normal sinus rhythm without any ischemic changes. She occasionally gets some cramping in her legs and her hands. She is takes magnesium oxide but this is not remedy the problem. After talking to her daughter she is convinced this is related to pravastatin. She's been on this for  some time with good cholesterol control.  06/15/2016  Rachel Wong was seen today in follow-up. I recently saw her when she was hospitalized for acute gallstone pancreatitis. There was a brief episode of atrial fibrillation however frequent PACs were noted. This did not meet candidacy for anticoagulation. She's had no recurrence. Since discharge she has done well. She denies a chest pain or worsening shortness of breath. She is restarted her cholesterol medication and is due for repeat lipid profile. She also saw her endocrinologist who started her on Jardiance.   12/31/2016  Rachel Wong returns today for follow-up. Recently she started doing Silver sneakers when a friend told her about it and she found out it was free. She now exercises about a few times a week and uses a Systems analyst. She says she is moving a lot better and has less joint pain. She is less short of breath. She denies any chest pain or recurrent palpitations. Blood pressure is well-controlled today. Her cholesterol is well-controlled in January total cholesterol 139, HDL-C 45, LDL-C 66 and triglycerides 139.  10/06/2017  Rachel Wong returns today for follow-up.  She is done well over the past year.  She denies chest pain or worsening shortness of breath.  Blood pressure is well controlled.  EKG is stable showing sinus rhythm.  Blood sugars have been fairly well controlled on metformin and Jardiance.  She takes low-dose pravastatin.  She is compliant with daily aspirin.  04/11/2019  Rachel Wong is seen today in follow-up.  Overall she continues to do well.  She recently saw her primary care provider and her endocrinologist.  Her hemoglobin  A1c has gone up from 6.1-7.  She is noted some dietary changes.  She attributes this to spending more time with her new boyfriend for which she is doing a lot of cooking.  Cholesterol has been fairly well controlled.  She denies any chest pain.  Her blood pressure is at goal today.  04/16/2020  Rachel Wong  returns today for follow-up.  Overall she seems to be doing well.  She has a scheduled follow-up with her PCP in a few weeks.  Her last A1c was 7.2.  Blood pressure is excellent today.  She denies any chest pain or worsening shortness of breath.  She is looking to start an exercise program again.  07/30/2021  Rachel Wong is seen today in follow-up.  She is doing very well at [facility].  She is exercising regularly and is lost weight since I saw her.  Weight is down to 151 from 168.  Accordingly her blood pressure has been running lower.  When she saw her PCP in November it was in the 80s systolic and they decreased her enalapril from 20 to 10 mg.  Today it was 94/61 and the highest reading she has had recently was in the 110 range.  I think she could actually lower the dose further to 5 mg daily.  Lipids have been well controlled with recent total cholesterol 131, triglycerides 155 HDL 46 and LDL 85.  She reports no new or worsening palpitations  09/14/2022  Rachel Wong for follow-up.  Unfortunately she was hospitalized just a few days ago with a stroke.  She said she had word finding difficulty at home and told her daughter and was ultimately taken to the doctor's office.  EKG there showed new onset atrial fibrillation.  She was transferred to the [facility] regional emergency room and was evaluated including a cardiac evaluation.  She had an echocardiogram which showed normal LVEF 60 to 65%, moderate left atrial enlargement, mild MR, mild TR and aortic valve sclerosis.  She also had carotid Dopplers which showed mild bilateral carotid artery stenosis.  Her aspirin was stopped and she was switched to Eliquis.  She may have spontaneously converted to sinus rhythm.  EKG today shows sinus rhythm with PACs.  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 Langston Reusing    CHOLECYSTECTOMY N/A 05/02/2016   Procedure: LAPAROSCOPIC CHOLECYSTECTOMY WITH INTRAOPERATIVE CHOLANGIOGRAM;  Surgeon: Violeta Gelinas, MD;  Location: [facility] OR;  Service: General;  Laterality: N/A;   ERCP N/A 04/29/2016   Procedure: ENDOSCOPIC RETROGRADE CHOLANGIOPANCREATOGRAPHY (ERCP);  Surgeon: Dorena Cookey, MD;  Location: [facility] ENDOSCOPY;  Service: Endoscopy;  Laterality: N/A;   KNEE ARTHROSCOPY Right 10/2001   ROTATOR CUFF REPAIR  1999   TOE SURGERY Right 04/2003   TRANSTHORACIC ECHOCARDIOGRAM  05/2010   EF=>55%, stage 1 diastolic dysfunction; mild MR; mild AVR & AV mildly sclerotic    FAMHx:  Family History  Problem Relation  Age of Onset   Cancer Mother    Alzheimer's disease Mother    Lung cancer Father    Cancer Maternal Grandmother    Heart attack Maternal Grandfather    Liver disease Brother        transplant   Thyroid disease Child        thyroidectomy    SOCHx:   reports that she quit smoking about 56 years ago. Her smoking use included cigarettes. She started smoking about 76 years ago. She has a 20 pack-year smoking history. She has never used smokeless tobacco. No history on file for alcohol use and drug use.  ALLERGIES:  Allergies  Allergen Reactions   Clindamycin Diarrhea and Rash    ROS: Pertinent items noted in HPI and remainder of comprehensive ROS otherwise negative.  HOME MEDS: Current Outpatient Medications  Medication Sig Dispense Refill   apixaban (ELIQUIS) 5 MG TABS tablet Take 1 tablet (5 mg total) by mouth 2 (two) times daily. 180 tablet 3   Cholecalciferol 25 MCG (1000 UT) tablet  Take by mouth.     Coenzyme Q10 (CO Q10) 100 MG CAPS Take by mouth.     empagliflozin (JARDIANCE) 25 MG TABS tablet Take 25 mg by mouth daily.     enalapril (VASOTEC) 5 MG tablet Take 1 tablet (5 mg total) by mouth daily. 90 tablet 3   magnesium oxide (MAG-OX) 400 MG tablet Take 400 mg by mouth daily.     metFORMIN (GLUCOPHAGE) 500 MG tablet Take 1,000 mg by mouth 2 (two) times daily with a meal.     metoprolol succinate (TOPROL-XL) 100 MG 24 hr tablet Take 50 mg by mouth daily.      Multiple Vitamin (MULTIVITAMIN) capsule Take 1 capsule by mouth daily.     Omega-3 Fatty Acids (FISH OIL) 1000 MG CAPS Take 1 capsule by mouth daily.      omeprazole (PRILOSEC) 40 MG capsule Take 1 capsule by mouth 2 (two) times daily before a meal.     pravastatin (PRAVACHOL) 40 MG tablet Take 1 tablet by mouth daily.     No current facility-administered medications for this visit.    LABS/IMAGING: No results found for this or any previous visit (from the past 48 hour(s)). No results found.  VITALS: BP 128/72 (BP Location: Left Arm, Patient Position: Sitting, Cuff Size: Normal)   Pulse 66   Ht 5\' 6"  (1.676 m)   Wt 160 lb 9.6 oz (72.8 kg)   SpO2 96%   BMI 25.92 kg/m   EXAM: General appearance: alert and no distress Neck: no carotid bruit, no JVD and thyroid not enlarged, symmetric, no tenderness/mass/nodules Lungs: clear to auscultation bilaterally Heart: regular rate and rhythm, S1, S2 normal, no murmur, click, rub or gallop Abdomen: soft, non-tender; bowel sounds normal; no masses,  no organomegaly Extremities: extremities normal, atraumatic, no cyanosis or edema and varicose veins noted Pulses: 2+ and symmetric Skin: Skin color, texture, turgor normal. No rashes or lesions Neurologic: Grossly normal Psych: Pleasant  EKG: Normal sinus rhythm with PACs at 97-personally reviewed  ASSESSMENT: Status post stroke PAF-on Eliquis Palpitations - PAC's Hypertension-at goal on enalapril and  metoprolol Dyslipidemia-On pravastatin IDDM -well controlled, A1c 6.2 (on Jardiance and insulin)  PLAN: 1.   Mrs. Biddick has a history of PACs and unfortunately developed A-fib and presented with stroke.  She has not had any deficits from this which is also fortunate.  She is now on Eliquis and aspirin was discontinued.  She is  in sinus rhythm today with PACs.  There would be no benefit towards cardioversion.  Blood pressure appears well-controlled.  Would recommend continuing further therapy for now.  Plan follow-up with me annually or sooner as needed.  Chrystie Nose, MD, [facility], FACP  [place]  [facility]  Medical Director of the [facility] Lipid [facility]  Cardiovascular Risk Reduction [facility] Diplomate of the American Board of Clinical Lipidology Attending Cardiologist  Direct Dial: [phone]  Fax: [phone]  Website:  [email or web address] Nicely Japji Kok 04/26/2023, 3:14 PM

## 2023-10-12 ENCOUNTER — Other Ambulatory Visit: Payer: Self-pay

## 2023-10-12 DIAGNOSIS — Z79899 Other long term (current) drug therapy: Secondary | ICD-10-CM

## 2023-10-12 DIAGNOSIS — I480 Paroxysmal atrial fibrillation: Secondary | ICD-10-CM

## 2023-10-12 DIAGNOSIS — I491 Atrial premature depolarization: Secondary | ICD-10-CM

## 2023-10-12 MED ORDER — METOPROLOL SUCCINATE ER 100 MG PO TB24: 50.0000 mg | ORAL_TABLET | Freq: Every day | ORAL | 3 refills | Status: AC

## 2023-10-12 MED ORDER — APIXABAN 5 MG PO TABS: 5.0000 mg | ORAL_TABLET | Freq: Two times a day (BID) | ORAL | 1 refills | Status: DC

## 2023-10-12 NOTE — Telephone Encounter (Signed)
 Received faxed refill request for Eliquis  and Metoprolol  from [facility] pharmacy.  Pt last saw Dr Maximo Spar 04/26/23, last labs 05/14/23 Creat 1.02, age 83, weight 72.8kg, based on specified criteria pt is on appropriate dosage of Eliquis  5mg  BID for afib.  Will refill rx.

## 2023-12-21 ENCOUNTER — Ambulatory Visit: Attending: Internal Medicine | Admitting: Internal Medicine

## 2023-12-21 ENCOUNTER — Encounter: Payer: Self-pay | Admitting: Internal Medicine

## 2023-12-21 VITALS — BP 114/74 | HR 91 | Ht 65.5 in | Wt 176.6 lb

## 2023-12-21 DIAGNOSIS — R011 Cardiac murmur, unspecified: Secondary | ICD-10-CM | POA: Diagnosis not present

## 2023-12-21 DIAGNOSIS — R600 Localized edema: Secondary | ICD-10-CM

## 2023-12-21 DIAGNOSIS — I4811 Longstanding persistent atrial fibrillation: Secondary | ICD-10-CM | POA: Diagnosis not present

## 2023-12-21 DIAGNOSIS — I10 Essential (primary) hypertension: Secondary | ICD-10-CM | POA: Diagnosis not present

## 2023-12-21 DIAGNOSIS — Z7901 Long term (current) use of anticoagulants: Secondary | ICD-10-CM

## 2023-12-21 MED ORDER — FUROSEMIDE 20 MG PO TABS: 20.0000 mg | ORAL_TABLET | Freq: Every day | ORAL | 2 refills | Status: DC

## 2023-12-21 NOTE — Patient Instructions (Addendum)
 Medication Instructions:  START LASIX  20 mg daily *If you need a refill on your cardiac medications before your next appointment, please call your pharmacy*  Lab Work: BMET and BNP in one week  If you have labs (blood work) drawn today and your tests are completely normal, you will receive your results only by: MyChart Message (if you have MyChart) OR A paper copy in the mail If you have any lab test that is abnormal or we need to change your treatment, we will call you to review the results.  Follow-Up: At [facility], you and your health needs are our priority.  As part of our continuing mission to provide you with exceptional heart care, our providers are all part of one team.  This team includes your primary Cardiologist (physician) and Advanced Practice Providers or APPs (Physician Assistants and Nurse Practitioners) who all work together to provide you with the care you need, when you need it.  Your next appointment:   1 month with any APP 1 year with Dr. Mona

## 2023-12-21 NOTE — Progress Notes (Signed)
 OFFICE NOTE  Chief Complaint:  Follow-up  Primary Care Physician: Spry, Heather M., MD  HPI:  Rachel Wong  is a 83 year old female previously followed by Dr. Morgan with history of palpitations improved with electrolyte replacement and adjustments in her beta blocker. She also has hypertension which has been well controlled, hyperlipidemia and diabetes. She reports being fairly stable. I reviewed results from her primary care provider's office. She recently had a cholesterol check which showed total cholesterol to be 158. Her triglycerides were slightly elevated and she is continuing to work on her diet. She has lost several pounds and I think this will be helpful for her. With her and her blood glucose control, her hemoglobin A1c is 5.8 and has been stable at that level. She denies any further palpitations. She occasionally gets cramps in her left leg and does have varicose veins. There is a slight amount of swelling and she previously had left knee surgery. Other than those few episodes, she is really asymptomatic.  Rachel Wong returns today for follow-up. She denies any palpitations. She reports that as long she takes her magnesium  she seems to be doing fairly well. She is talking about possibly retiring in December. 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 Her blood pressure is well controlled. She reports her A1c is at 6 which represents good control.  Rachel Wong returns today for follow-up. She is reportedly asymptomatic, denying any palpitations, chest pain or shortness of breath. She has mildly low blood pressure today although reports a generally is between 120 and 140 systolic. Her EKG shows normal sinus rhythm without any ischemic changes. She occasionally gets some cramping in her legs and her hands. She is takes magnesium  oxide but this is not remedy the problem. After talking to her daughter she is convinced this is related to pravastatin . She's been on this for  some time with good cholesterol control.  06/15/2016  Rachel Wong was seen today in follow-up. I recently saw her when she was hospitalized for acute gallstone pancreatitis. There was a brief episode of atrial fibrillation however frequent PACs were noted. This did not meet candidacy for anticoagulation. She's had no recurrence. Since discharge she has done well. She denies a chest pain or worsening shortness of breath. She is restarted her cholesterol medication and is due for repeat lipid profile. She also saw her endocrinologist who started her on Jardiance.   12/31/2016  Rachel Wong returns today for follow-up. Recently she started doing Silver sneakers when a friend told her about it and she found out it was free. She now exercises about a few times a week and uses a Systems analyst. She says she is moving a lot better and has less joint pain. She is less short of breath. She denies any chest pain or recurrent palpitations. Blood pressure is well-controlled today. Her cholesterol is well-controlled in January total cholesterol 139, HDL-C 45, LDL-C 66 and triglycerides 139.  10/06/2017  Rachel Wong returns today for follow-up.  She is done well over the past year.  She denies chest pain or worsening shortness of breath.  Blood pressure is well controlled.  EKG is stable showing sinus rhythm.  Blood sugars have been fairly well controlled on metformin  and Jardiance.  She takes low-dose pravastatin .  She is compliant with daily aspirin.  04/11/2019  Rachel Wong is seen today in follow-up.  Overall she continues to do well.  She recently saw her primary care provider and her endocrinologist.  Her hemoglobin  A1c has gone up from 6.1-7.  She is noted some dietary changes.  She attributes this to spending more time with her new boyfriend for which she is doing a lot of cooking.  Cholesterol has been fairly well controlled.  She denies any chest pain.  Her blood pressure is at goal today.  04/16/2020  Rachel Wong  returns today for follow-up.  Overall she seems to be doing well.  She has a scheduled follow-up with her PCP in a few weeks.  Her last A1c was 7.2.  Blood pressure is excellent today.  She denies any chest pain or worsening shortness of breath.  She is looking to start an exercise program again.  07/30/2021  Rachel Wong is seen today in follow-up.  She is doing very well at [facility].  She is exercising regularly and is lost weight since I saw her.  Weight is down to 151 from 168.  Accordingly her blood pressure has been running lower.  When she saw her PCP in November it was in the 80s systolic and they decreased her enalapril  from 20 to 10 mg.  Today it was 94/61 and the highest reading she has had recently was in the 110 range.  I think she could actually lower the dose further to 5 mg daily.  Lipids have been well controlled with recent total cholesterol 131, triglycerides 155 HDL 46 and LDL 85.  She reports no new or worsening palpitations  09/14/2022  Rachel Wong for follow-up.  Unfortunately she was hospitalized just a few days ago with a stroke.  She said she had word finding difficulty at home and told her daughter and was ultimately taken to the doctor's office.  EKG there showed new onset atrial fibrillation.  She was transferred to the [facility] regional emergency room and was evaluated including a cardiac evaluation.  She had an echocardiogram which showed normal LVEF 60 to 65%, moderate left atrial enlargement, mild MR, mild TR and aortic valve sclerosis.  She also had carotid Dopplers which showed mild bilateral carotid artery stenosis.  Her aspirin was stopped and she was switched to Eliquis .  She may have spontaneously converted to sinus rhythm.  EKG today shows sinus rhythm with PACs.  04/27/2023  Rachel Wong is seen today in follow-up.  Overall she says she is feeling well.  Blood pressure appears to be well-controlled today.  She is on lipid-lowering therapy with pravastatin  and her  cholesterol is followed by her primary care provider.  EKG shows atrial fibrillation today which is likely longstanding persistent if not permanent.  She is asymptomatic with this.  In general he is rate controlled.  She is on Eliquis  for anticoagulation.  No bleeding issues with this or unusual falls.  12/21/2023  Ms. Canion returns today for follow-up.  Overall she seems to be doing well except she has been having some lower extremity edema.  She says it is gone on for couple months.  She notes the swelling does not necessarily improve with elevation of her legs.  She denies any pain with walking.  EKG today shows persistent atrial fibrillation.  Blood pressure is well-controlled.  She had an echo last year at the time she had a stroke which showed normal LV function and aortic valve sclerosis with mild mitral and tricuspid regurgitation and normal RV function.  She denies any shortness of breath or heart failure symptoms or any chest pain.  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 RONAL Ly    CHOLECYSTECTOMY N/A 05/02/2016   Procedure: LAPAROSCOPIC CHOLECYSTECTOMY WITH INTRAOPERATIVE CHOLANGIOGRAM;  Surgeon: Dann Hummer, MD;  Location: [facility] OR;  Service: General;  Laterality: N/A;   ERCP N/A 04/29/2016   Procedure: ENDOSCOPIC RETROGRADE CHOLANGIOPANCREATOGRAPHY (ERCP);  Surgeon: Norleen Hint, MD;  Location: [facility] ENDOSCOPY;  Service: Endoscopy;  Laterality: N/A;   KNEE ARTHROSCOPY  Right 10/2001   ROTATOR CUFF REPAIR  1999   TOE SURGERY Right 04/2003   TRANSTHORACIC ECHOCARDIOGRAM  05/2010   EF=>55%, stage 1 diastolic dysfunction; mild MR; mild AVR & AV mildly sclerotic    FAMHx:  Family History  Problem Relation Age of Onset   Cancer Mother    Alzheimer's disease Mother    Lung cancer Father    Cancer Maternal Grandmother    Heart attack Maternal Grandfather    Liver disease Brother        transplant   Thyroid disease Child        thyroidectomy    SOCHx:   reports that she quit smoking about 56 years ago. Her smoking use included cigarettes. She started smoking about 76 years ago. She has a 20 pack-year smoking history. She has never used smokeless tobacco. No history on file for alcohol use and drug use.  ALLERGIES:  Allergies  Allergen Reactions   Clindamycin Diarrhea and Rash    ROS: Pertinent items noted in HPI and remainder of comprehensive ROS otherwise negative.  HOME MEDS: Current Outpatient Medications  Medication Sig Dispense Refill   apixaban  (ELIQUIS ) 5 MG TABS tablet Take 1 tablet (5 mg total) by mouth 2 (two) times daily. 180 tablet 1   Cholecalciferol 25 MCG (1000 UT) tablet Take by mouth.     Coenzyme Q10 (CO Q10) 100 MG CAPS Take by mouth.     empagliflozin (JARDIANCE) 25 MG TABS tablet Take 25 mg by mouth daily.     enalapril  (VASOTEC ) 5 MG tablet Take 1 tablet (5 mg total) by mouth daily. 90 tablet 3   magnesium  oxide (MAG-OX) 400 MG tablet Take 400 mg by mouth daily.     metFORMIN  (GLUCOPHAGE ) 500 MG tablet Take 1,000 mg by mouth 2 (two) times daily with a meal.     metoprolol  succinate (TOPROL -XL) 100 MG 24 hr tablet Take 0.5 tablets (50 mg total) by mouth daily. 90 tablet 3   Multiple Vitamin (MULTIVITAMIN) capsule Take 1 capsule by mouth daily.     Omega-3 Fatty Acids (FISH OIL) 1000 MG CAPS Take 1 capsule by mouth daily.      omeprazole (PRILOSEC) 40 MG capsule Take 1 capsule by mouth 2 (two) times daily before a meal.      pravastatin  (PRAVACHOL ) 40 MG tablet Take 1 tablet by mouth daily.     No current facility-administered medications for this visit.    LABS/IMAGING: No results found for this or any previous visit (from the past 48 hours). No results found.  VITALS:  BP 114/74   Pulse 91   Ht 5' 5.5 (1.664 m)   Wt 176 lb 9.6 oz (80.1 kg)   SpO2 97%   BMI 28.94 kg/m   EXAM: General appearance: alert and no distress Neck: no carotid bruit, no JVD, and thyroid not enlarged, symmetric, no tenderness/mass/nodules Lungs: clear to auscultation bilaterally Heart: irregularly irregular rhythm, S1, S2 normal, and systolic murmur: early systolic 2/6, crescendo at 2nd right intercostal space Abdomen: soft, non-tender; bowel sounds normal; no masses,  no organomegaly Extremities: edema 1-2+ bilateral and varicose veins noted Pulses: 2+ and symmetric Skin: Skin color, texture, turgor normal. No rashes or lesions Neurologic: Grossly normal Psych: Pleasant  EKG: EKG Interpretation Date/Time:  Tuesday December 21 2023 13:44:48 EDT Ventricular Rate:  91 PR Interval:    QRS Duration:  74 QT Interval:  342 QTC Calculation: 420 R Axis:   14  Text Interpretation: Atrial fibrillation Low voltage QRS Septal infarct (cited on or before 26-Apr-2023) When compared with ECG of 26-Apr-2023 15:01, No significant change since last tracing Confirmed by Mona Kent [phone]) on 12/21/2023 1:55:31 PM    ASSESSMENT: Bilateral leg edema Status post stroke Longstanding persistent atrial fibrillation-on Eliquis  Palpitations - PAC's Hypertension-at goal on enalapril  and metoprolol  Dyslipidemia-On pravastatin  IDDM -well controlled, 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   Mrs. Heeney has complained of recent lower extremity edema.  There is swelling in both legs which is pitting to the mid shin.  She denies any heart failure symptoms such as orthopnea.  JVD does not appear elevated.  Her lungs are clear.  Her last echo  in 2024 showed normal systolic function and normal right heart function with only mild MR and TR.  She is in A-fib which is likely longstanding persistent.  She is on Eliquis  therefore DVT is unlikely.  I would recommend starting Lasix  20 mg daily.  Will plan repeat lab work in 1 week including a metabolic profile and BNP.  Schedule follow-up with APP in about a month.  We may need to titrate her diuretic.  Plan otherwise follow-up with me in a year or sooner as necessary.  Kent KYM Mona, MD, [facility], FNLA, FACP  [place]  [facility]  Medical Director of the [facility]  Cardiovascular Risk Reduction Clinic Diplomate of the American Board of Clinical Lipidology Attending Cardiologist  Direct Dial: [phone]  Fax: [phone]  Website:  [email or web address] Kent BROCKS Britlee Skolnik 12/21/2023, 1:55 PM

## 2023-12-31 LAB — BASIC METABOLIC PANEL WITH GFR
BUN/Creatinine Ratio: 67 — ABNORMAL HIGH (ref 12–28)
BUN: 71 mg/dL — ABNORMAL HIGH (ref 8–27)
CO2: 20 mmol/L (ref 20–29)
Calcium: 9.4 mg/dL (ref 8.7–10.3)
Chloride: 101 mmol/L (ref 96–106)
Creatinine, Ser: 1.06 mg/dL — ABNORMAL HIGH (ref 0.57–1.00)
Glucose: 148 mg/dL — ABNORMAL HIGH (ref 70–99)
Potassium: 4.6 mmol/L (ref 3.5–5.2)
Sodium: 141 mmol/L (ref 134–144)
eGFR: 52 mL/min/1.73 — ABNORMAL LOW (ref >59)

## 2023-12-31 LAB — BRAIN NATRIURETIC PEPTIDE: BNP: 210 pg/mL — ABNORMAL HIGH (ref 0.0–100.0)

## 2024-01-03 ENCOUNTER — Ambulatory Visit: Payer: Self-pay | Admitting: Internal Medicine

## 2024-01-03 DIAGNOSIS — Z79899 Other long term (current) drug therapy: Secondary | ICD-10-CM

## 2024-01-05 ENCOUNTER — Encounter: Payer: Self-pay | Admitting: Internal Medicine

## 2024-01-05 ENCOUNTER — Encounter: Payer: Self-pay | Admitting: *Deleted

## 2024-01-10 ENCOUNTER — Other Ambulatory Visit: Payer: Self-pay | Admitting: *Deleted

## 2024-01-10 DIAGNOSIS — I10 Essential (primary) hypertension: Secondary | ICD-10-CM

## 2024-01-10 NOTE — Progress Notes (Signed)
 Lab orders  have been placed and patient has been made aware via mychart.

## 2024-01-18 ENCOUNTER — Other Ambulatory Visit: Payer: Self-pay

## 2024-01-18 DIAGNOSIS — I10 Essential (primary) hypertension: Secondary | ICD-10-CM

## 2024-01-18 NOTE — Telephone Encounter (Signed)
 Called patient's daughter and left message instructing patient to have repeat labs done this week.   Josie RN

## 2024-01-20 ENCOUNTER — Ambulatory Visit: Payer: Self-pay | Admitting: Internal Medicine

## 2024-01-20 LAB — BUN: BUN: 46 mg/dL — ABNORMAL HIGH (ref 8–27)

## 2024-02-08 NOTE — Progress Notes (Unsigned)
 Cardiology Office Note    Date:  02/10/2024  ID:  Rachel Wong, DOB [birth year], MRN [id number] PCP:  Ryan Powell HERO., MD  Cardiologist:  Vinie JAYSON Maxcy, MD  Electrophysiologist:  None   Chief Complaint: Follow up for lower extremity edema   History of Present Illness: .    Rachel Wong is a 83 y.o. female with visit-pertinent history of atrial fibrillation, HTN, palpitations, CVA in 08/2022.   Patient has been followed by Dr. Maxcy for a history of palpitations.  On chart review in 09/2022 patient was admitted with a stroke.  Was noted that she had word finding difficulty at home, was taken to her doctor's office there and EKG showed new onset atrial fibrillation, she was transferred to [facility] emergency room.  Echo indicated normal LVEF 60 to 65%, moderate left atrial enlargement, mild MR, mild TR and aortic valve sclerosis, carotid Dopplers showed mild bilateral carotid artery stenosis, her aspirin was discontinued and switched switched to Eliquis .  It was felt that she spontaneously converted to sinus rhythm.  Patient was last seen in clinic by Dr. Maxcy on 12/21/2023, it was noted that she had persistent atrial fibrillation.  Patient denied any chest pain or shortness of breath.  She did complain of recent lower extremity edema, noted to have swelling in both legs which was pitting to the mid shin.  She denied any orthopnea and JVD did not appear elevated.  Patient was started on Lasix  20 mg daily.  Her BNP was elevated at 210, patient was informed to stop Lasix  and only take as needed.  Today she presents for follow-up.  She reports that she is doing well overall. She reports that she walks on the [place] 2 miles every day. She denies chest pain, shortness of breath, dizziness, presyncope or syncope.  Patient reports that she has completely discontinued use of Lasix , notes that her legs are mildly swollen however this is not limiting her activity, is not causing her any pain.  She  is not interested in restarting on Lasix .  She also reports that she does not wear compression stockings and does not plan to start as she notes they are uncomfortable.  She does avoid adding salt to her diet, has her meals at [facility] greens where she resides. ROS: .   Today she denies chest pain, shortness of breath, lower extremity edema, fatigue, palpitations, melena, hematuria, hemoptysis, diaphoresis, weakness, presyncope, syncope, orthopnea, and PND.  All other systems are reviewed and otherwise negative. Studies Reviewed: SABRA   EKG:  EKG is not ordered today.  CV Studies: Cardiac studies reviewed are outlined and summarized above. Otherwise please see EMR for full report. Cardiac Studies & Procedures   ______________________________________________________________________________________________   STRESS TESTS  NM MYOCAR MULTI W/SPECT W 06/03/2000   ECHOCARDIOGRAM  ECHOCARDIOGRAM COMPLETE 04/29/2016  Narrative *[place]* *[facility]* [address] [place], [place] [address] [phone]  ------------------------------------------------------------------- Transthoracic Echocardiography  (Report amended )  Patient:    Rachel Wong, Rachel Wong MR #:       [id number] Study Date: 04/29/2016 Gender:     F Age:        83 Height:     167.6 cm Weight:     84.1 kg BSA:        2 m^2 Pt. Status: Room:  ATTENDING    Danton Reyes ONEIDA TISA     Dina Camie BRAVO REFERRING    Dina Camie E ADMITTING    Toribio Bowman  PERFORMING   [facility], Inpatient SONOGRAPHER  Augustin Seals  cc:  ------------------------------------------------------------------- LV EF: 60% -   65%  ------------------------------------------------------------------- Indications:      Atrial fibrillation - 427.31.  ------------------------------------------------------------------- History:   Risk factors:  Former tobacco use. Hypertension. Diabetes mellitus.  Dyslipidemia.  ------------------------------------------------------------------- Study Conclusions  - Left ventricle: The cavity size was normal. Wall thickness was increased in a pattern of mild LVH. There was focal basal hypertrophy. Systolic function was normal. The estimated ejection fraction was in the range of 60% to 65%. Wall motion was normal; there were no regional wall motion abnormalities. Features are consistent with a pseudonormal left ventricular filling pattern, with concomitant abnormal relaxation and increased filling pressure (grade 2 diastolic dysfunction). - Mitral valve: Mildly calcified annulus. Valve area by continuity equation (using LVOT flow): 2.12 cm^2. - Left atrium: The atrium was moderately dilated. - Right atrium: The atrium was mildly dilated. - Pulmonary arteries: Systolic pressure was moderately increased. PA peak pressure: 42 mm Hg (S).  ------------------------------------------------------------------- Study data:  Comparison was made to the study of 05/22/2010.  Study status:  Routine.  Procedure:  The patient reported no pain pre or post test. Transthoracic echocardiography. Image quality was adequate.  Study completion:  There were no complications. Transthoracic echocardiography.  M-mode, complete 2D, spectral Doppler, and color Doppler.  Birthdate:  Patient birthdate: 1941-04-15.  Age:  Patient is 83 yr old.  Sex:  Gender: female. BMI: 29.9 kg/m^2.  Blood pressure:     137/57  Patient status: Inpatient.  Study date:  Study date: 04/29/2016. Study time: 10:33 AM.  Location:  ICU/CCU  -------------------------------------------------------------------  ------------------------------------------------------------------- Left ventricle:  The cavity size was normal. Wall thickness was increased in a pattern of mild LVH. There was focal basal hypertrophy. Systolic function was normal. The estimated ejection fraction was in the range of 60%  to 65%. Wall motion was normal; there were no regional wall motion abnormalities. Features are consistent with a pseudonormal left ventricular filling pattern, with concomitant abnormal relaxation and increased filling pressure (grade 2 diastolic dysfunction).  ------------------------------------------------------------------- Aortic valve:   Structurally normal valve.   Cusp separation was normal.  Doppler:  Transvalvular velocity was within the normal range. There was no stenosis. There was no regurgitation.  ------------------------------------------------------------------- Aorta:  Aortic root: The aortic root was normal in size. Ascending aorta: The ascending aorta was normal in size.  ------------------------------------------------------------------- Mitral valve:   Mildly calcified annulus. Leaflet separation was normal.  Doppler:  Transvalvular velocity was within the normal range. There was no evidence for stenosis. There was trivial regurgitation.    Valve area by continuity equation (using LVOT flow): 2.12 cm^2. Indexed valve area by continuity equation (using LVOT flow): 1.06 cm^2/m^2.    Mean gradient (D): 5 mm Hg. Peak gradient (D): 8 mm Hg.  ------------------------------------------------------------------- Left atrium:  The atrium was moderately dilated.  ------------------------------------------------------------------- Right ventricle:  The cavity size was normal. Systolic function was normal.  ------------------------------------------------------------------- Pulmonic valve:    The valve appears to be grossly normal. Doppler:  There was no significant regurgitation.  ------------------------------------------------------------------- Tricuspid valve:   The valve appears to be grossly normal. Doppler:  There was mild regurgitation.  ------------------------------------------------------------------- Pulmonary artery:   Systolic pressure was moderately  increased.  ------------------------------------------------------------------- Right atrium:  The atrium was mildly dilated.  ------------------------------------------------------------------- Pericardium:  There was no pericardial effusion.  ------------------------------------------------------------------- Systemic veins: Inferior vena cava: The vessel was normal in size. The respirophasic diameter changes were in the normal range (= 50%), consistent  with normal central venous pressure.  ------------------------------------------------------------------- Measurements  Left ventricle                            Value          Reference LV ID, ED, PLAX chordal           (L)     42    mm       43 - 52 LV ID, ES, PLAX chordal                   26.5  mm       23 - 38 LV fx shortening, PLAX chordal            37    %        >=29 LV PW thickness, ED                       10.8  mm       --------- IVS/LV PW ratio, ED                       1.08           <=1.3 Stroke volume, 2D                         108   ml       --------- Stroke volume/bsa, 2D                     54    ml/m^2   --------- LV e&', lateral                            9.74  cm/s     --------- LV E/e&', lateral                          14.89          --------- LV e&', medial                             6.47  cm/s     --------- LV E/e&', medial                           22.41          --------- LV e&', average                            8.11  cm/s     --------- LV E/e&', average                          17.89          ---------  Ventricular septum                        Value          Reference IVS thickness, ED                         11.7  mm       ---------  LVOT  Value          Reference LVOT ID, S                                21    mm       --------- LVOT area                                 3.46  cm^2     --------- LVOT peak velocity, S                     139   cm/s      --------- LVOT mean velocity, S                     73.2  cm/s     --------- LVOT VTI, S                               31.2  cm       --------- LVOT peak gradient, S                     8     mm Hg    ---------  Aorta                                     Value          Reference Aortic root ID, ED                        26    mm       ---------  Left atrium                               Value          Reference LA ID, A-P, ES                            38    mm       --------- LA ID/bsa, A-P                            1.9   cm/m^2   <=2.2 LA volume, S                              82.5  ml       --------- LA volume/bsa, S                          41.2  ml/m^2   --------- LA volume, ES, 1-p A4C                    62.5  ml       --------- LA volume/bsa, ES, 1-p A4C                31.2  ml/m^2   --------- LA volume, ES, 1-p A2C  91.5  ml       --------- LA volume/bsa, ES, 1-p A2C                45.7  ml/m^2   ---------  Mitral valve                              Value          Reference Mitral E-wave peak velocity               145   cm/s     --------- Mitral A-wave peak velocity               127   cm/s     --------- Mitral mean velocity, D                   102   cm/s     --------- Mitral deceleration time                  229   ms       150 - 230 Mitral mean gradient, D                   5     mm Hg    --------- Mitral peak gradient, D                   8     mm Hg    --------- Mitral E/A ratio, peak                    1.1            --------- Mitral valve area, LVOT                   2.12  cm^2     --------- continuity Mitral valve area/bsa, LVOT               1.06  cm^2/m^2 --------- continuity Mitral annulus VTI, D                     51    cm       ---------  Pulmonary arteries                        Value          Reference PA pressure, S, DP                (H)     42    mm Hg    <=30  Tricuspid valve                           Value          Reference Tricuspid  regurg peak velocity            314   cm/s     --------- Tricuspid peak RV-RA gradient             39    mm Hg    ---------  Systemic veins                            Value          Reference Estimated CVP  3     mm Hg    ---------  Right ventricle                           Value          Reference TAPSE                                     33.4  mm       --------- RV pressure, S, DP                (H)     42    mm Hg    <=30 RV s&', lateral, S                         14.8  cm/s     ---------  Legend: (L)  and  (H)  mark values outside specified reference range.  ------------------------------------------------------------------- Delman Aleene Passe, M.D. [id number]:48          ______________________________________________________________________________________________       Current Reported Medications:.    Current Meds  Medication Sig   apixaban  (ELIQUIS ) 5 MG TABS tablet Take 1 tablet (5 mg total) by mouth 2 (two) times daily.   Cholecalciferol 25 MCG (1000 UT) tablet Take by mouth.   Coenzyme Q10 (CO Q10) 100 MG CAPS Take by mouth.   empagliflozin (JARDIANCE) 25 MG TABS tablet Take 25 mg by mouth daily.   enalapril  (VASOTEC ) 5 MG tablet Take 1 tablet (5 mg total) by mouth daily.   magnesium  oxide (MAG-OX) 400 MG tablet Take 400 mg by mouth daily.   metFORMIN  (GLUCOPHAGE ) 500 MG tablet Take 1,000 mg by mouth 2 (two) times daily with a meal.   metoprolol  succinate (TOPROL -XL) 100 MG 24 hr tablet Take 0.5 tablets (50 mg total) by mouth daily.   Multiple Vitamin (MULTIVITAMIN) capsule Take 1 capsule by mouth daily.   Omega-3 Fatty Acids (FISH OIL) 1000 MG CAPS Take 1 capsule by mouth daily.    omeprazole (PRILOSEC) 40 MG capsule Take 1 capsule by mouth 2 (two) times daily before a meal.   pravastatin  (PRAVACHOL ) 40 MG tablet Take 1 tablet by mouth daily.   [DISCONTINUED] furosemide  (LASIX ) 20 MG tablet Take 1 tablet (20 mg total) by  mouth daily.    Physical Exam:    VS:  BP 114/62   Pulse 89   Ht 5' 5 (1.651 m)   Wt 175 lb 9.6 oz (79.7 kg)   SpO2 97%   BMI 29.22 kg/m    Wt Readings from Last 3 Encounters:  02/10/24 175 lb 9.6 oz (79.7 kg)  12/21/23 176 lb 9.6 oz (80.1 kg)  04/26/23 160 lb 9.6 oz (72.8 kg)    GEN: Well nourished, well developed in no acute distress NECK: No JVD; No carotid bruits CARDIAC: IRIR, 2/6 systolic murmur, no rubs or gallops RESPIRATORY:  Clear to auscultation without rales, wheezing or rhonchi  ABDOMEN: Soft, non-tender, non-distended EXTREMITIES: +1 edema to lower shins; No acute deformity     Asessement and Plan:.    Atrial fibrillation: Per notes appears to be longstanding persistent atrial fibrillation.  Today she reports that she is doing well, remains asymptomatic, denies any shortness of breath or feelings of increased palpitations.  She denies any bleeding problems on Eliquis .  Reviewed ED precautions.  Check CBC and BMET.   Lower extremity edema: At last office visit patient was noted to have increased bilateral lower extremity edema, was started on Lasix  however had worsening renal function.  Patient reports that she completely discontinued use of Lasix .  On exam today she has +1 to the lower shin, she denies any shortness of breath, orthopnea, PND.  Patient reports she is not interested in restarting on Lasix  and does not plan to wear compression stockings.  Recommended increased leg elevation and decrease salt intake.  Patient will notify the office of any worsening she notes she does not want to keep Lasix  on hand.  Discussed repeating echocardiogram, patient deferred at this time.  Reviewed ED precautions.  Check BMET.   HTN: Blood pressure today 114/62.  Continue enalapril  5 mg daily and metoprolol  succinate 50 mg daily.  CVA: Patient with history of stroke in 09/2022, found to be in new onset atrial fibrillation.  Patient denies any recurrence.  HLD: On Pravastatin ,  patient notes that she has a physical upcoming.    Disposition: F/u with Dr. Mona in six months per patient request.   Signed, Adrain Nesbit D Aneudy Champlain, NP

## 2024-02-10 ENCOUNTER — Ambulatory Visit: Attending: Cardiology | Admitting: Cardiology

## 2024-02-10 ENCOUNTER — Encounter: Payer: Self-pay | Admitting: Cardiology

## 2024-02-10 VITALS — BP 114/62 | HR 89 | Ht 65.0 in | Wt 175.6 lb

## 2024-02-10 DIAGNOSIS — I10 Essential (primary) hypertension: Secondary | ICD-10-CM

## 2024-02-10 DIAGNOSIS — R600 Localized edema: Secondary | ICD-10-CM

## 2024-02-10 DIAGNOSIS — Z7901 Long term (current) use of anticoagulants: Secondary | ICD-10-CM

## 2024-02-10 DIAGNOSIS — I4811 Longstanding persistent atrial fibrillation: Secondary | ICD-10-CM

## 2024-02-10 NOTE — Patient Instructions (Signed)
 Medication Instructions:   No changes    Discontinue Lasix    *If you need a refill on your cardiac medications before your next appointment, please call your pharmacy*   Lab Work: [facility] CMP If you have labs (blood work) drawn today and your tests are completely normal, you will receive your results only by: MyChart Message (if you have MyChart) OR A paper copy in the mail If you have any lab test that is abnormal or we need to change your treatment, we will call you to review the results.   Testing/Procedures: Not needed   Follow-Up: At [facility], you and your health needs are our priority.  As part of our continuing mission to provide you with exceptional heart care, we have created designated Provider Care Teams.  These Care Teams include your primary Cardiologist (physician) and Advanced Practice Providers (APPs -  Physician Assistants and Nurse Practitioners) who all work together to provide you with the care you need, when you need it.     Your next appointment:   6 month(s)   ( call in Oct 2025 for appt in March 2026)  The format for your next appointment:   In Person  Provider:   Vinie JAYSON Maxcy, MD

## 2024-02-11 ENCOUNTER — Encounter: Payer: Self-pay | Admitting: Cardiology

## 2024-02-11 ENCOUNTER — Ambulatory Visit: Payer: Self-pay | Admitting: Cardiology

## 2024-02-11 LAB — CBC
Hematocrit: 37.6 % (ref 34.0–46.6)
Hemoglobin: 11.4 g/dL (ref 11.1–15.9)
MCH: 25.8 pg — ABNORMAL LOW (ref 26.6–33.0)
MCHC: 30.3 g/dL — ABNORMAL LOW (ref 31.5–35.7)
MCV: 85 fL (ref 79–97)
Platelets: 271 x10E3/uL (ref 150–450)
RBC: 4.42 x10E6/uL (ref 3.77–5.28)
RDW: 14.8 % (ref 11.7–15.4)
WBC: 6.4 x10E3/uL (ref 3.4–10.8)

## 2024-02-11 LAB — COMPREHENSIVE METABOLIC PANEL WITH GFR
ALT: 12 IU/L (ref 0–32)
AST: 16 IU/L (ref 0–40)
Albumin: 4.2 g/dL (ref 3.7–4.7)
Alkaline Phosphatase: 99 IU/L (ref 44–121)
BUN/Creatinine Ratio: 48 — AB (ref 12–28)
BUN: 48 mg/dL — AB (ref 8–27)
Bilirubin Total: 0.4 mg/dL (ref 0.0–1.2)
CO2: 21 mmol/L (ref 20–29)
Calcium: 9.6 mg/dL (ref 8.7–10.3)
Chloride: 103 mmol/L (ref 96–106)
Creatinine, Ser: 1.01 mg/dL — AB (ref 0.57–1.00)
Globulin, Total: 2.4 g/dL (ref 1.5–4.5)
Glucose: 126 mg/dL — AB (ref 70–99)
Potassium: 4.6 mmol/L (ref 3.5–5.2)
Sodium: 143 mmol/L (ref 134–144)
Total Protein: 6.6 g/dL (ref 6.0–8.5)
eGFR: 55 mL/min/1.73 — AB (ref >59)

## 2024-03-08 ENCOUNTER — Other Ambulatory Visit: Payer: Self-pay | Admitting: Internal Medicine

## 2024-03-08 DIAGNOSIS — Z79899 Other long term (current) drug therapy: Secondary | ICD-10-CM

## 2024-03-08 DIAGNOSIS — I480 Paroxysmal atrial fibrillation: Secondary | ICD-10-CM

## 2024-03-08 DIAGNOSIS — I491 Atrial premature depolarization: Secondary | ICD-10-CM

## 2024-03-08 NOTE — Telephone Encounter (Signed)
 Prescription refill request for Eliquis  received. Indication:afib Last office visit:9/25 Scr:1.01  9/25 Age: 83 Weight:79.7  kg  Prescription refilled

## 2024-08-16 ENCOUNTER — Ambulatory Visit: Admitting: Cardiology
